# Patient Record
Sex: Male | Born: 1980
Health system: Southern US, Community
[De-identification: ages and names within clinical notes are randomized; demographics above are authoritative.]

## PROBLEM LIST (undated history)

## (undated) DIAGNOSIS — J45909 Unspecified asthma, uncomplicated: Secondary | ICD-10-CM

## (undated) DIAGNOSIS — J301 Allergic rhinitis due to pollen: Secondary | ICD-10-CM

## (undated) DIAGNOSIS — Z9884 Bariatric surgery status: Secondary | ICD-10-CM

## (undated) DIAGNOSIS — I10 Essential (primary) hypertension: Secondary | ICD-10-CM

## (undated) DIAGNOSIS — F988 Other specified behavioral and emotional disorders with onset usually occurring in childhood and adolescence: Secondary | ICD-10-CM

## (undated) DIAGNOSIS — S060X0A Concussion without loss of consciousness, initial encounter: Secondary | ICD-10-CM

## (undated) HISTORY — DX: Essential (primary) hypertension: I10

## (undated) HISTORY — DX: Unspecified asthma, uncomplicated: J45.909

## (undated) HISTORY — DX: Allergic rhinitis due to pollen: J30.1

## (undated) HISTORY — DX: Bariatric surgery status: Z98.84

## (undated) HISTORY — DX: Concussion without loss of consciousness, initial encounter: S06.0X0A

## (undated) HISTORY — DX: Other specified behavioral and emotional disorders with onset usually occurring in childhood and adolescence: F98.8

## (undated) HISTORY — PX: CHOLECYSTECTOMY: SHX55

---

## 2009-05-18 ENCOUNTER — Ambulatory Visit: Payer: Self-pay | Admitting: Otolaryngology

## 2009-08-23 ENCOUNTER — Ambulatory Visit: Payer: Self-pay | Admitting: Family Medicine

## 2009-08-23 DIAGNOSIS — F988 Other specified behavioral and emotional disorders with onset usually occurring in childhood and adolescence: Secondary | ICD-10-CM | POA: Insufficient documentation

## 2009-09-22 ENCOUNTER — Telehealth: Payer: Self-pay | Admitting: Family Medicine

## 2009-11-29 ENCOUNTER — Ambulatory Visit: Payer: Self-pay | Admitting: Family Medicine

## 2010-09-14 ENCOUNTER — Encounter: Payer: Self-pay | Admitting: Family Medicine

## 2010-10-17 ENCOUNTER — Ambulatory Visit: Payer: Self-pay | Admitting: Family Medicine

## 2010-10-17 DIAGNOSIS — I10 Essential (primary) hypertension: Secondary | ICD-10-CM | POA: Insufficient documentation

## 2010-10-17 HISTORY — DX: Essential (primary) hypertension: I10

## 2010-11-01 ENCOUNTER — Ambulatory Visit
Admission: RE | Admit: 2010-11-01 | Discharge: 2010-11-01 | Payer: Self-pay | Source: Home / Self Care | Attending: Family Medicine | Admitting: Family Medicine

## 2010-11-01 DIAGNOSIS — J45901 Unspecified asthma with (acute) exacerbation: Secondary | ICD-10-CM | POA: Insufficient documentation

## 2010-11-01 DIAGNOSIS — J4520 Mild intermittent asthma, uncomplicated: Secondary | ICD-10-CM | POA: Insufficient documentation

## 2010-12-04 NOTE — Assessment & Plan Note (Signed)
Summary: 9:00 ROA/RBH   Vital Signs:  Patient profile:   30 year old male Height:      74 inches Weight:      324.0 pounds BMI:     41.75 Temp:     97.8 degrees F oral Pulse rate:   80 / minute Pulse rhythm:   regular BP sitting:   108 / 80  (left arm) Cuff size:   large  Vitals Entered By: Benny Lennert CMA Duncan Dull) (November 29, 2009 9:08 AM)  History of Present Illness: Chief complaint patient needs new med for add  30 year old with positive adult ADD screen on last exam and was intolerant to [place].  Had some depression, but that got better some Felt somewhat agitated while on the medicine  Did not seem to have much positve effects - was on for a month. Does not really think that ist was helpful at all.   All depressed symptoms have resolved. Now not sleeping well and has a new puppy.   Did not listen well in class. Looking back, thinks that he may have been highly distractable in class.     Allergies (verified): No Known Drug Allergies  Past History:  Past medical, surgical, family and social histories (including risk factors) reviewed, and no changes noted (except as noted below).  Past Medical History: Reviewed history from 08/23/2009 and no changes required. ADD  Past Surgical History: Reviewed history from 08/23/2009 and no changes required. gallbladder 2007 chronic sinusitis 2010  Family History: Reviewed history from 08/23/2009 and no changes required. heart disease  Social History: Reviewed history from 08/23/2009 and no changes required. Occupation:software Art gallery manager Married Never Smoked Alcohol use-yes Drug use-no Regular exercise-yes   Impression & Recommendations:  Problem # 1:  ADD (ICD-314.00) >15 minutes spent in total face to face time with the patient with >50% of time spent in counselling and coordination of care: 100% in counselling, failure to respond to straterra with SE, still with symptoms, longstanding but no diagnosis as  a child. Discussed some of the risks of lifelong stimulants, and that i rec psych eval for full ADD evaluation.  Orders: Psychiatric Referral (Psych)  Complete Medication List: 1)  Claritin 10 Mg Tabs (Loratadine) .... One a day  Patient Instructions: 1)  Referral Appointment Information 2)  Day/Date: 3)  Time: 4)  Place/MD: 5)  Address: 6)  Phone/Fax: 7)  Patient given appointment information. Information/Orders faxed/mailed.   Current Allergies (reviewed today): No known allergies

## 2010-12-04 NOTE — Letter (Signed)
Summary: [facility]   [facility]   Imported By: Lanelle Bal 10/04/2010 11:01:44  _____________________________________________________________________  External Attachment:    Type:   Image     Comment:   External Document  Appended Document: [facility]  Patient should follow-up  ENT saw Blood pressure high, needs to be checked.  Appended Document: [facility]  Patient sent to front to schedule appt.Consuello Masse CMA

## 2010-12-06 NOTE — Assessment & Plan Note (Signed)
Summary: HECKING COUGH, CONGESTION / LFW   Vital Signs:  Patient profile:   30 year old male Height:      74 inches Weight:      337.0 pounds BMI:     43.42 Temp:     98.7 degrees F oral Pulse rate:   80 / minute Pulse rhythm:   regular BP sitting:   120 / 72  (left arm) Cuff size:   large  Vitals Entered By: Benny Lennert CMA Duncan Dull) (November 01, 2010 11:54 AM)  History of Present Illness: Chief complaint hecking cough with congestion  30 year old male:  Feeling bad for about a week thought was just allergies  has been taking allergy meds  cough comes and goes.  Has been lingering for a while.   Always has induced allergy induced asthma.   Acute Visit History:      The patient complains of cough, nasal discharge, sinus problems, and sore throat.  These symptoms began 1 week ago.  He denies abdominal pain, chest pain, diarrhea, earache, eye symptoms, and headache.  Other comments include: HISTORY OF ASTHMA.        The patient notes wheezing and shortness of breath.  There is no history of sleep interference, respiratory retractions, tachypnea, cyanosis, or interference with oral intake associated with his cough.        He complains of sinus pressure.  The patient has had a past history of sinusitis and previous sinus surgery.        Urine output has been normal.        Allergies (verified): No Known Drug Allergies  Past History:  Past medical, surgical, family and social histories (including risk factors) reviewed, and no changes noted (except as noted below).  Past Medical History: ADD Asthma Allergic Rhinitis  Past Surgical History: Reviewed history from 08/23/2009 and no changes required. gallbladder 2007 chronic sinusitis 2010   Family History: Reviewed history from 08/23/2009 and no changes required. heart disease  Social History: Reviewed history from 08/23/2009 and no changes required. 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 CV: No chest pain or SOB GI: No noted N or V Otherwise, pertinent positives and negatives are noted in the HPI.   Physical Exam  Additional Exam:  GEN: A and O x 3. WDWN. NAD.    ENT: Nose clear, ext NML.  No LAD.  No JVD.  TM's clear. Oropharynx clear.  PULM: Normal WOB, no distress. poor air movement diffusely with scattered rhonchi and wheezing. CV: RRR, no M/G/R, No rubs, No JVD.   ABD: S, NT, ND, + BS. No rebound. No guarding. No HSM.   EXT: warm and well-perfused, No c/c/e. PSYCH: Pleasant and conversant.    Impression & Recommendations:  Problem # 1:  BRONCHITIS- ACUTE (ICD-466.0)  His updated medication list for this problem includes:    Singulair 10 Mg Tabs (Montelukast sodium) ..... One tablet daily    Azithromycin 250 Mg Tabs (Azithromycin) .Marland Kitchen... 2 by  mouth today and then 1 daily for 4 days    Ventolin Hfa 108 (90 Base) Mcg/act Aers (Albuterol sulfate) .Marland Kitchen... 2 puffs q 4 hours as needed wheezing  Problem # 2:  ASTHMA NOS W/ACUTE EXACERBATION (ICD-493.92)  His updated medication list for this problem includes:    Singulair 10 Mg Tabs (Montelukast sodium) ..... One tablet daily  Ventolin Hfa 108 (90 Base) Mcg/act Aers (Albuterol sulfate) .Marland Kitchen... 2 puffs q 4 hours as needed wheezing  Complete Medication List: 1)  Claritin 10 Mg Tabs (Loratadine) .... One a day 2)  Ritalin 20 Mg Tabs (Methylphenidate hcl) .... Take one tablet twice daily 3)  Omnaris 50 Mcg/act Susp (Ciclesonide) .... 2 sprays in each nostril once daily 4)  Melatonin 5 Mg Tabs (Melatonin) .... One tablet as needed for sleep at bedtime 5)  Singulair 10 Mg Tabs (Montelukast sodium) .... One tablet daily 6)  Azithromycin 250 Mg Tabs (Azithromycin) .... 2 by  mouth today and then 1 daily for 4 days 7)  Ventolin Hfa 108 (90 Base) Mcg/act Aers (Albuterol sulfate) .... 2 puffs q 4 hours as  needed wheezing  Patient Instructions: 1)  BRONCHITIS 2)  -Viral or baterial infections of the lung. Fever, cough, chest pain, shortness of breath, phlegm production, fatigue are symptoms. 3)  Treatment: 4)  1. Take all medicines 5)  2. Antibiotics  6)  3. Cough suppressants 7)  4. Bronchodilators: an inhaler 8)  5. Expectorant like Guaifenesin (Robitussin, Mucinex) 9)  Fluids and Moisture help: drink lots of fluids 10)  Vaporizier or humidifier in room, shower steam 11)  --help loosen secretions and sooth breathing passages 12)  Elevate head slightly when trying to sleep.  Prescriptions: VENTOLIN HFA 108 (90 BASE) MCG/ACT AERS (ALBUTEROL SULFATE) 2 puffs q 4 hours as needed wheezing  #1 x 2   Entered and Authorized by:   Hannah Beat MD   Signed by:   Hannah Beat MD on 11/01/2010   Method used:   Electronically to        [id number] (retail)       [address]       [place], [place]  [address]       Ph: [phone]       Fax: [phone]   RxID:   [id number] AZITHROMYCIN 250 MG  TABS (AZITHROMYCIN) 2 by  mouth today and then 1 daily for 4 days  #6 x 0   Entered and Authorized by:   Hannah Beat MD   Signed by:   Hannah Beat MD on 11/01/2010   Method used:   Electronically to        [id number] (retail)       [address]       [place], [place]  [address]       Ph: [phone]       Fax: [phone]   RxID:   [id number]    Orders Added: 1)  Est. Patient Level IV [30160]    Current Allergies (reviewed today): No known allergies

## 2010-12-06 NOTE — Assessment & Plan Note (Signed)
Summary: DISCUSS BP PER DR COPLAND/CLE   Vital Signs:  Patient profile:   30 year old male Height:      74 inches Weight:      331.8 pounds BMI:     42.75 Temp:     98.7 degrees F oral Pulse rate:   80 / minute Pulse rhythm:   regular BP sitting:   132 / 90  (left arm) Cuff size:   large  Vitals Entered By: Benny Lennert CMA Duncan Dull) (October 17, 2010 10:49 AM)  Serial Vital Signs/Assessments:  Time      Position  BP       Pulse  Resp  Temp     By                     120/85                         Hannah Beat MD   History of Present Illness: Chief complaint discuss BP  Allergies (verified): No Known Drug Allergies   Impression & Recommendations:  Problem # 1:  ELEVATED BLOOD PRESSURE WITHOUT DIAGNOSIS OF HYPERTENSION (ICD-796.2) 120/85 with a thigh cuff I am not convinced this is BP.  He has 28 inch arms.  >15 minutes spent in face to face time with patient, >50% spent in counselling or coordination of care: discussed exercise modification, weight loss, exercise, running, biking, spinning. Weight goal is less than 300 by end of feb. Wants to get down to 240.   Problem # 2:  MORBID OBESITY (ICD-278.01)  Complete Medication List: 1)  Claritin 10 Mg Tabs (Loratadine) .... One a day 2)  Ritalin 20 Mg Tabs (Methylphenidate hcl) .... Take one tablet twice daily 3)  Omnaris 50 Mcg/act Susp (Ciclesonide) .... 2 sprays in each nostril once daily 4)  Melatonin 5 Mg Tabs (Melatonin) .... One tablet as needed for sleep at bedtime 5)  Singulair 10 Mg Tabs (Montelukast sodium) .... One tablet daily   Orders Added: 1)  Est. Patient Level III [14782]    Current Allergies (reviewed today): No known allergies

## 2011-10-25 ENCOUNTER — Other Ambulatory Visit: Payer: Self-pay | Admitting: Otolaryngology

## 2011-12-26 ENCOUNTER — Ambulatory Visit: Payer: Self-pay | Admitting: Otolaryngology

## 2011-12-27 LAB — PATHOLOGY REPORT

## 2012-05-20 LAB — URINALYSIS, COMPLETE
Bacteria: NONE SEEN
Bilirubin,UR: NEGATIVE
Glucose,UR: NEGATIVE mg/dL (ref 0–75)
Leukocyte Esterase: NEGATIVE
Nitrite: NEGATIVE
Protein: NEGATIVE
Specific Gravity: 1.021 (ref 1.003–1.030)

## 2012-05-20 LAB — COMPREHENSIVE METABOLIC PANEL
Alkaline Phosphatase: 76 U/L (ref 50–136)
Anion Gap: 11 (ref 7–16)
BUN: 12 mg/dL (ref 7–18)
Bilirubin,Total: 0.9 mg/dL (ref 0.2–1.0)
Co2: 25 mmol/L (ref 21–32)
Creatinine: 1.11 mg/dL (ref 0.60–1.30)
EGFR (African American): >60
EGFR (Non-African Amer.): >60
Potassium: 4.1 mmol/L (ref 3.5–5.1)
SGPT (ALT): 32 U/L

## 2012-05-20 LAB — CBC
MCH: 28 pg (ref 26.0–34.0)
MCHC: 32.5 g/dL (ref 32.0–36.0)
Platelet: 167 10*3/uL (ref 150–440)
WBC: 16.3 10*3/uL — ABNORMAL HIGH (ref 3.8–10.6)

## 2012-05-21 ENCOUNTER — Inpatient Hospital Stay: Payer: Self-pay | Admitting: Internal Medicine

## 2012-05-21 LAB — CSF CELL COUNT WITH DIFFERENTIAL
Eosinophil: 0 %
Lymphocytes: 73 %
Neutrophils: 3 %
Other Cells: 0 %
RBC (CSF): 25 /mm3
WBC (CSF): 2 /mm3

## 2012-05-21 LAB — PROTEIN, BODY FLUID

## 2012-05-21 LAB — GLUCOSE, SEROUS FLUID: Glucose, Body Fluid: 62 mg/dL

## 2012-05-22 LAB — CBC WITH DIFFERENTIAL/PLATELET
Basophil %: 0.7 %
Eosinophil #: 0.3 10*3/uL (ref 0.0–0.7)
Eosinophil %: 4.1 %
HCT: 41 % (ref 40.0–52.0)
Lymphocyte %: 23.7 %
Monocyte %: 21.1 %
Neutrophil #: 4.2 10*3/uL (ref 1.4–6.5)
Neutrophil %: 50.4 %
Platelet: 170 10*3/uL (ref 150–440)
RBC: 4.8 10*6/uL (ref 4.40–5.90)
RDW: 13.8 % (ref 11.5–14.5)
WBC: 8.4 10*3/uL (ref 3.8–10.6)

## 2012-05-22 LAB — CLOSTRIDIUM DIFFICILE BY PCR

## 2012-05-26 LAB — CULTURE, BLOOD (SINGLE)

## 2012-05-27 ENCOUNTER — Ambulatory Visit (INDEPENDENT_AMBULATORY_CARE_PROVIDER_SITE_OTHER): Payer: 59 | Admitting: Family Medicine

## 2012-05-27 ENCOUNTER — Encounter: Payer: Self-pay | Admitting: Family Medicine

## 2012-05-27 VITALS — BP 120/82 | HR 74 | Temp 98.4°F | Ht 73.0 in | Wt 282.8 lb

## 2012-05-27 DIAGNOSIS — R935 Abnormal findings on diagnostic imaging of other abdominal regions, including retroperitoneum: Secondary | ICD-10-CM

## 2012-05-27 DIAGNOSIS — K5000 Crohn's disease of small intestine without complications: Secondary | ICD-10-CM

## 2012-05-27 NOTE — Progress Notes (Signed)
   Nature conservation officer at [facility] [address] [place] [address] Phone: [phone] Fax: [phone]  Date:  05/27/2012   Name:  Tristan Mcgee   DOB:  [birth year]   MRN:  [id number]  PCP:  Hannah Beat, MD    Chief Complaint: Follow-up   History of Present Illness:  Tristan Mcgee is a 31 y.o. very pleasant male patient who presents with the following:  Hospital f/u: terminal ileitus:  Admit: 05/21/2012 D/c 05/22/2012.   Patient was admitted to the hospital with an elevated white count, headache, fever. Ultimately, he had a lumbar puncture which was unremarkable, and he did have an abnormal CT of the abdomen and pelvis, with presumed terminal ileitis. Infectious versus inflammatory. He did improve and had a decreasing white count with by mouth ingestion of Flagyl and Cipro. Now he does have some diarrhea. Abdominal pain is much improved.  Past Medical History, Surgical History, Social History, Family History, Problem List, Medications, and Allergies have been reviewed and updated if relevant.  Current Outpatient Prescriptions on File Prior to Visit  Medication Sig Dispense Refill  . albuterol (PROVENTIL HFA;VENTOLIN HFA) 108 (90 BASE) MCG/ACT inhaler Inhale 2 puffs into the lungs every 6 (six) hours as needed.      . Loratadine 10 MG CAPS Take 1 capsule by mouth daily.      . methylphenidate (RITALIN) 20 MG tablet Take 20 mg by mouth 2 (two) times daily.      . montelukast (SINGULAIR) 10 MG tablet Take 10 mg by mouth at bedtime.        Review of Systems: ROS: GEN: Acute illness details above GI: Tolerating PO intake GU: maintaining adequate hydration and urination Pulm: No SOB Interactive and getting along well at home.  Otherwise, ROS is as per the HPI.   Physical Examination: Filed Vitals:   05/27/12 1039  BP: 120/82  Pulse: 74  Temp: 98.4 F (36.9 C)   Filed Vitals:   05/27/12 1039  Height: 6\' 1"  (1.854 m)  Weight: 282 lb 12.8 oz (128.277 kg)   Body  mass index is 37.31 kg/(m^2). Ideal Body Weight: Weight in (lb) to have BMI = 25: 189.1    GEN: WDWN, NAD, Non-toxic, A & O x 3 HEENT: Atraumatic, Normocephalic. Neck supple. No masses, No LAD. Ears and Nose: No external deformity. CV: RRR, No M/G/R. No JVD. No thrill. No extra heart sounds. PULM: CTA B, no wheezes, crackles, rhonchi. No retractions. No resp. distress. No accessory muscle use. EXTR: No c/c/e NEURO Normal gait.  PSYCH: Normally interactive. Conversant. Not depressed or anxious appearing.  Calm demeanor.    Assessment and Plan: 1. Terminal ileitis  Ambulatory referral to Gastroenterology  2. Abnormal abdominal CT scan  Ambulatory referral to Gastroenterology    Much improved, we need to make sure that he has appropriate followup with Dr. Riki Altes followup with his ileum changes seen on CT.   Hannah Beat, MD

## 2012-06-26 ENCOUNTER — Ambulatory Visit: Payer: Self-pay | Admitting: Gastroenterology

## 2012-10-20 ENCOUNTER — Telehealth: Payer: Self-pay | Admitting: Family Medicine

## 2012-10-20 ENCOUNTER — Encounter: Payer: Self-pay | Admitting: Family Medicine

## 2012-10-20 ENCOUNTER — Ambulatory Visit (INDEPENDENT_AMBULATORY_CARE_PROVIDER_SITE_OTHER): Payer: 59 | Admitting: Family Medicine

## 2012-10-20 VITALS — BP 130/82 | HR 65 | Temp 98.2°F | Ht 73.0 in | Wt 303.5 lb

## 2012-10-20 DIAGNOSIS — M545 Low back pain, unspecified: Secondary | ICD-10-CM | POA: Insufficient documentation

## 2012-10-20 DIAGNOSIS — R350 Frequency of micturition: Secondary | ICD-10-CM | POA: Insufficient documentation

## 2012-10-20 DIAGNOSIS — J029 Acute pharyngitis, unspecified: Secondary | ICD-10-CM | POA: Insufficient documentation

## 2012-10-20 LAB — POCT URINALYSIS DIPSTICK
Bilirubin, UA: NEGATIVE
Blood, UA: NEGATIVE
Glucose, UA: NEGATIVE
Ketones, UA: NEGATIVE
Leukocytes, UA: NEGATIVE
Nitrite, UA: NEGATIVE
Protein, UA: NEGATIVE
Spec Grav, UA: 1.02
Urobilinogen, UA: 0.2
pH, UA: 6

## 2012-10-20 NOTE — Progress Notes (Signed)
Subjective:    Patient ID: Tristan Mcgee, male    DOB: Apr 04, 1981, 31 y.o.   MRN: [id number]  Urinary Frequency  This is a new problem. The current episode started in the past 7 days (in last 4 days). The problem occurs intermittently. The problem has been waxing and waning. The pain is at a severity of 0/10. The patient is experiencing no pain. There has been no fever. He is sexually active. There is no history of pyelonephritis. Associated symptoms include frequency and urgency. Pertinent negatives include no chills, discharge, flank pain, hematuria, nausea, sweats or vomiting. Associated symptoms comments: Low back pain at waist level on left x yesterday.. Constant pain then but improved today. Uncomfortable but not severe. More pain with movement.  No known injury,  He has changed exercise in last few weeks. Lifting weights in last few weeks.. He has tried NSAIDs for the symptoms. The treatment provided mild relief. There is no history of catheterization, kidney stones, recurrent UTIs, a single kidney, urinary stasis or a urological procedure.  Sore Throat  This is a new problem. The current episode started in the past 7 days. The pain is worse on the left side. There has been no fever. Associated symptoms include congestion and headaches. Pertinent negatives include no coughing, ear pain, shortness of breath, swollen glands, trouble swallowing or vomiting. Associated symptoms comments: Mild post nasal drip. He has had no exposure to strep or mono. Exposure to: Had tonsils out in Feb for tonsil stones.Marland Kitchen no problems since then.HAs been around dust lately which may be irritating things..     31 year old pt of Dr. Cyndie Chime.    Review of Systems  Constitutional: Negative for chills.  HENT: Positive for congestion. Negative for ear pain and trouble swallowing.   Respiratory: Negative for cough and shortness of breath.   Gastrointestinal: Negative for nausea and vomiting.  Genitourinary: Positive  for urgency and frequency. Negative for hematuria and flank pain.  Musculoskeletal: Positive for back pain.  Neurological: Positive for headaches.       Objective:   Physical Exam  Constitutional: Vital signs are normal. He appears well-developed and well-nourished.  Non-toxic appearance. He does not appear ill. No distress.  HENT:  Head: Normocephalic and atraumatic.  Right Ear: Hearing, tympanic membrane, external ear and ear canal normal. No tenderness. No foreign bodies. Tympanic membrane is not retracted and not bulging.  Left Ear: Hearing, tympanic membrane, external ear and ear canal normal. No tenderness. No foreign bodies. Tympanic membrane is not retracted and not bulging.  Nose: Nose normal. No mucosal edema or rhinorrhea. Right sinus exhibits no maxillary sinus tenderness and no frontal sinus tenderness. Left sinus exhibits no maxillary sinus tenderness and no frontal sinus tenderness.  Mouth/Throat: Uvula is midline, oropharynx is clear and moist and mucous membranes are normal. Normal dentition. No dental caries. No oropharyngeal exudate or tonsillar abscesses.  Eyes: Conjunctivae normal, EOM and lids are normal. Pupils are equal, round, and reactive to light. No foreign bodies found.  Neck: Trachea normal, normal range of motion and phonation normal. Neck supple. Carotid bruit is not present. No mass and no thyromegaly present.  Cardiovascular: Normal rate, regular rhythm, S1 normal, S2 normal, normal heart sounds, intact distal pulses and normal pulses.  Exam reveals no gallop.   No murmur heard. Pulmonary/Chest: Effort normal and breath sounds normal. No respiratory distress. He has no wheezes. He has no rhonchi. He has no rales.  Abdominal: Soft. Normal appearance and bowel sounds are  normal. There is no hepatosplenomegaly. There is tenderness in the suprapubic area. There is no rebound, no guarding and no CVA tenderness. No hernia.       ttp over left lumbar spine mild, neg  SLR, neg Faber's, strenght in lower ext 5/5.  Neurological: He is alert. He has normal reflexes.  Skin: Skin is warm, dry and intact. No rash noted.  Psychiatric: He has a normal mood and affect. His speech is normal and behavior is normal. Judgment normal.          Assessment & Plan:

## 2012-10-20 NOTE — Patient Instructions (Addendum)
Stop caffeine, avoif tomato, citris, alcohol, chocolate, smoking. Push water intake to 8 x 8 ox glasses a day. Gentle stretching exercises for back, can use ibuprofen as needed. Limit heavy lifting until improving. Call if symptoms not improving.

## 2012-10-20 NOTE — Assessment & Plan Note (Signed)
Likely musculoskeletal. Stretching info given. Can use NSAID.  Limit heavy lifting until improving.

## 2012-10-20 NOTE — Telephone Encounter (Signed)
Patient Information:  Caller Name: Marquell  Phone: [phone]  Patient: Tristan Mcgee, Tristan Mcgee  Gender: Male  DOB: 08/04/1981  Age: 31 Years  PCP: Hannah Beat (Family Practice)  Office Follow Up:  Does the office need to follow up with this patient?: No  Instructions For The Office: N/A  RN Note:  Patient is having back pain, urinary frequency and has a sore throat.  Symptoms  Reason For Call & Symptoms: frequent urination, back pain and sore throat  Reviewed Health History In EMR: Yes  Reviewed Medications In EMR: Yes  Reviewed Allergies In EMR: Yes  Reviewed Surgeries / Procedures: Yes  Date of Onset of Symptoms: 10/15/2012  Treatments Tried: Ibuprofen  Treatments Tried Worked: No  Guideline(s) Used:  Urination Pain - Male  Disposition Per Guideline:   Go to Office Now  Reason For Disposition Reached:   Side (flank) or lower back pain present  Advice Given:  Call Back If:  You become worse.  Appointment Scheduled:  10/20/2012 09:00:00 Appointment Scheduled Provider:  [facility])

## 2012-10-20 NOTE — Assessment & Plan Note (Signed)
Likely viral of allergic, irritant...symptomatic care.

## 2012-10-20 NOTE — Assessment & Plan Note (Addendum)
No sign of infection.  No clear suggestion of acute prostatitis.No glucose suggesting diabetes. Will check fasting  Glucose to eval further for DM given obesity. Most likely bladder irritation. Recommended dietary changes, pushing fluids and follow up if not improving.   PT OPTED AGAINST GLUCOSE TEST. No family history of DM. No thirst, mild fatigue.

## 2013-11-04 HISTORY — PX: SLEEVE GASTROPLASTY: SHX1101

## 2014-02-27 IMAGING — CT CT HEAD WITHOUT CONTRAST
1 series · 16 of 30 positions shown, 20 images · non-contrast
Comparison: none

REASON FOR EXAM: sinusitis x 1 week, headache, neck pain
COMMENTS:

PROCEDURE:     CT  - CT HEAD WITHOUT CONTRAST  - May 20, 2012 [DATE]
RESULT:     Comparison:  None
TECHNIQUE: Multiple axial images from the foramen magnum to the vertex were
obtained without IV contrast.

[Series 2: soft tissue · axial · 0.46mm/px · z∈[-104,+41]mm · 16 of 33 slices shown, 20 images]
[im 2/33  brain]
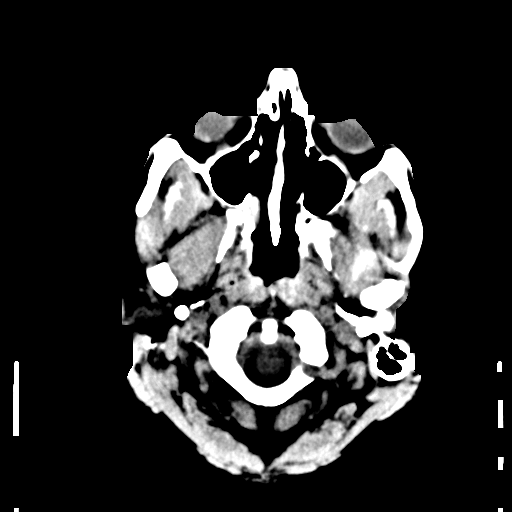
[im 2/33  bone]
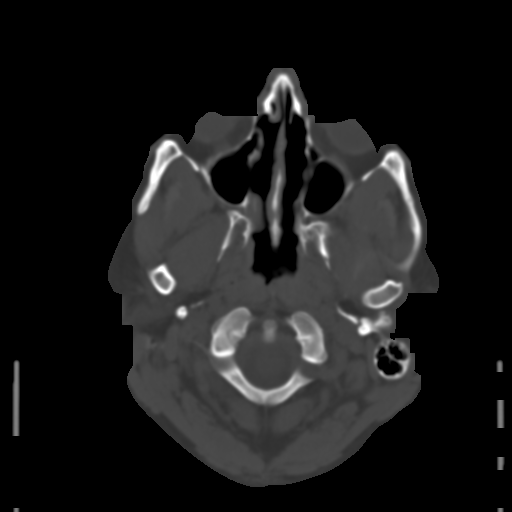
[im 4/33  brain]
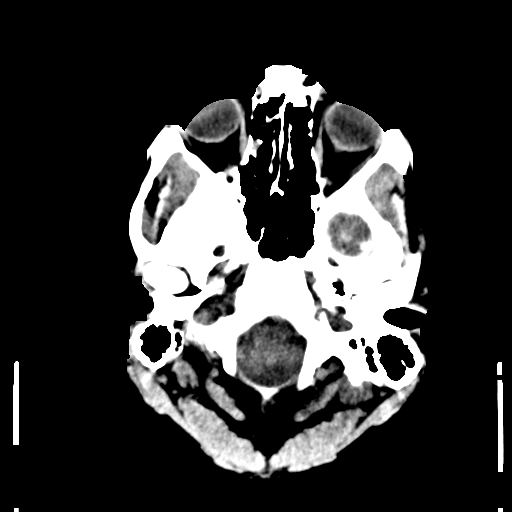
[im 6/33  brain]
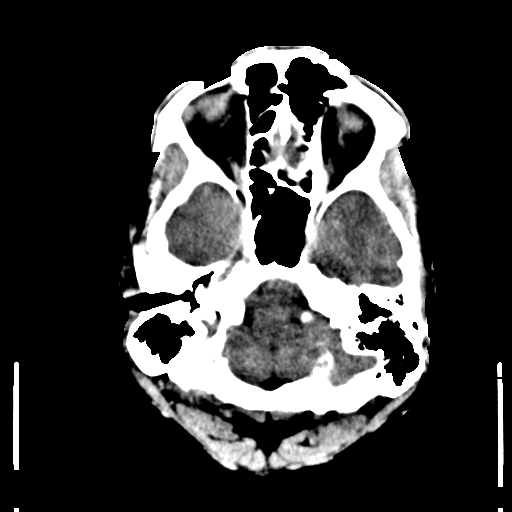
[im 8/33  brain]
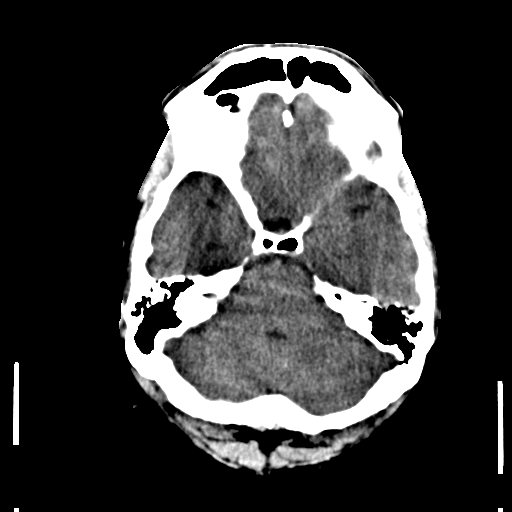
[im 9/33  brain]
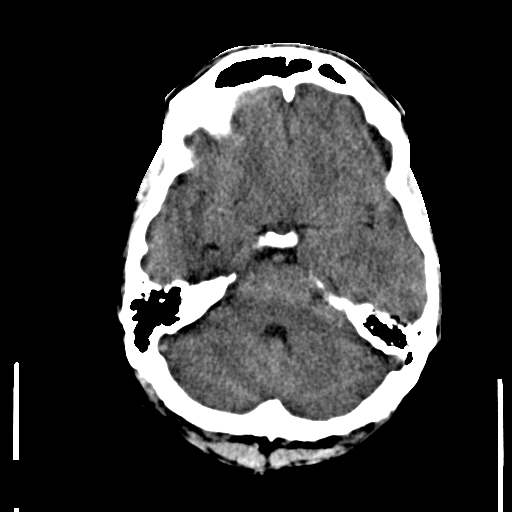
[im 9/33  bone]
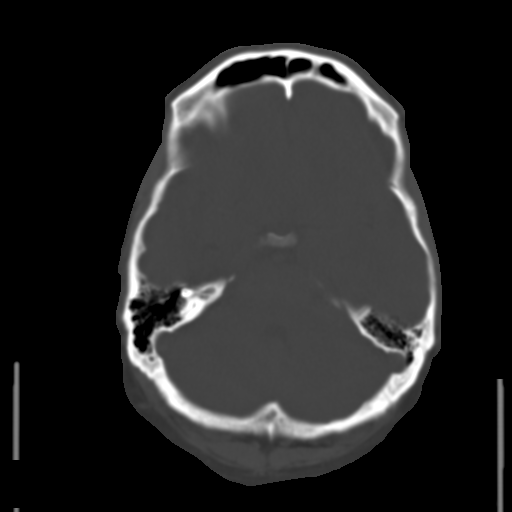
[im 12/33  brain]
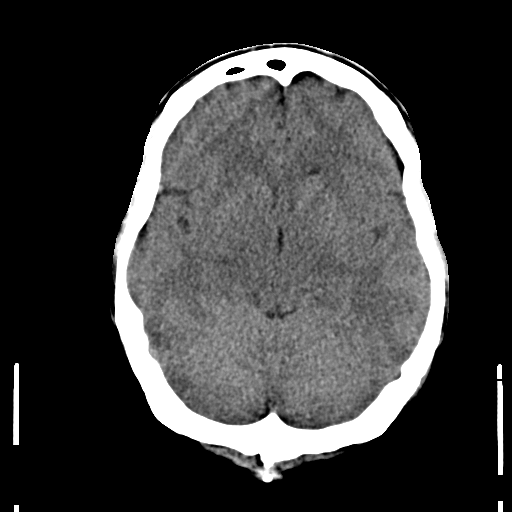
[im 14/33  brain]
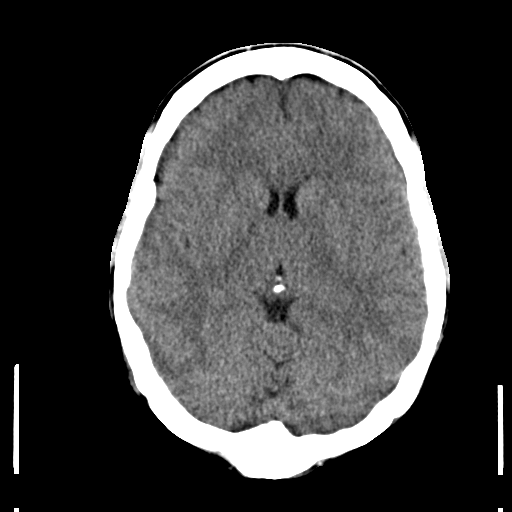
[im 16/33  brain]
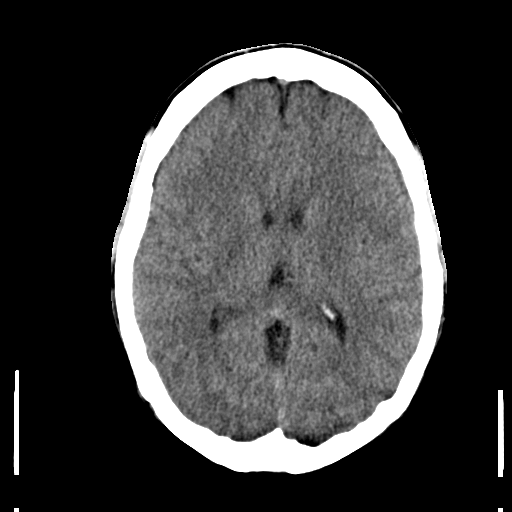
[im 17/33  brain]
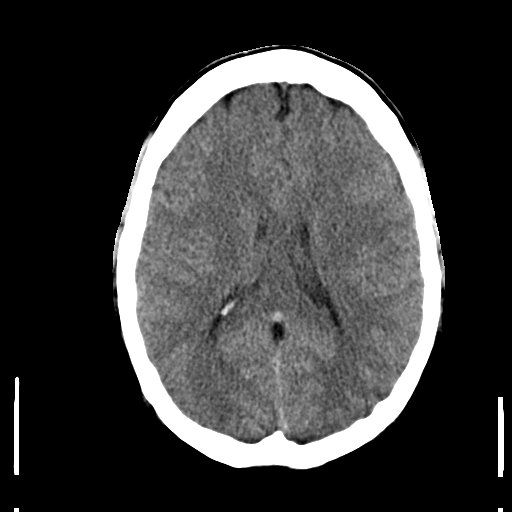
[im 17/33  bone]
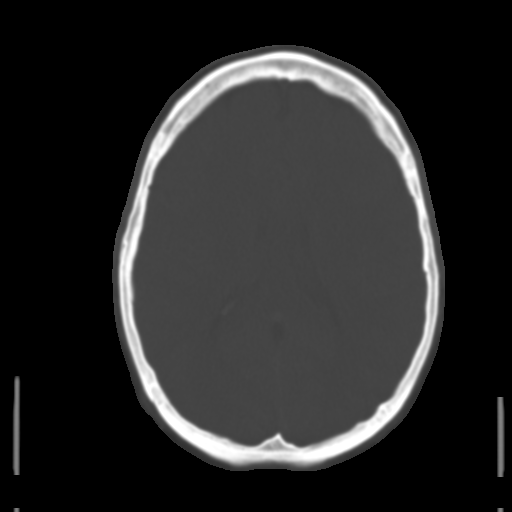
[im 19/33  brain]
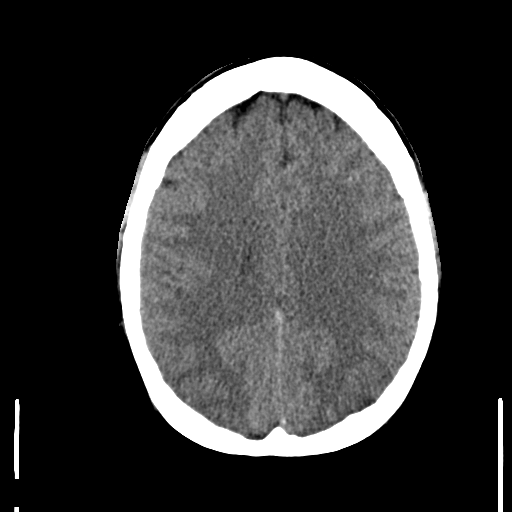
[im 21/33  brain]
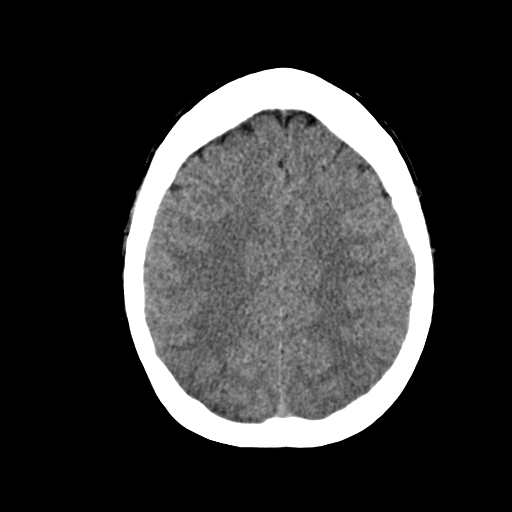
[im 24/33  brain]
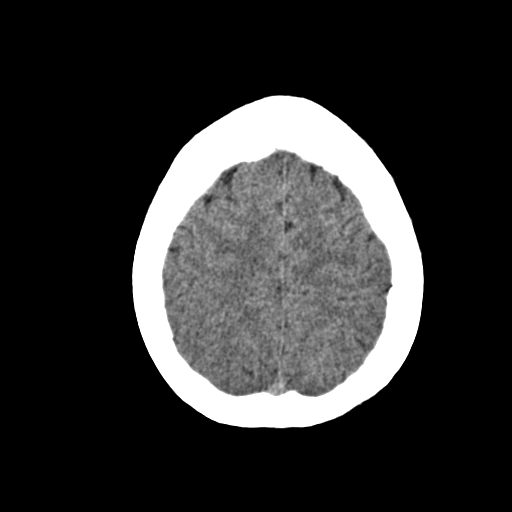
[im 25/33  brain]
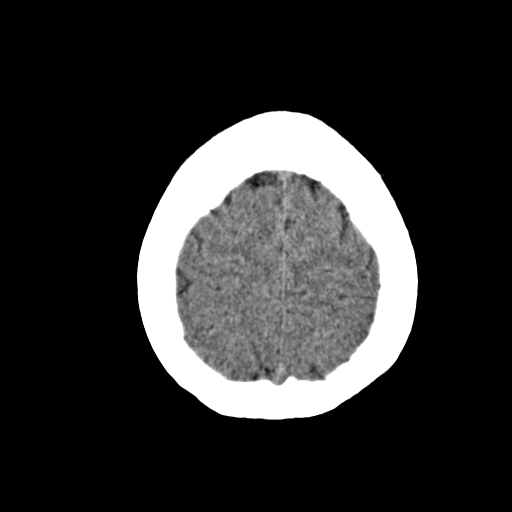
[im 25/33  bone]
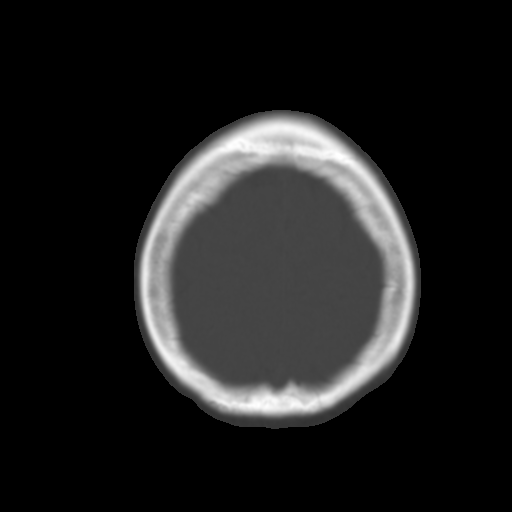
[im 27/33  brain]
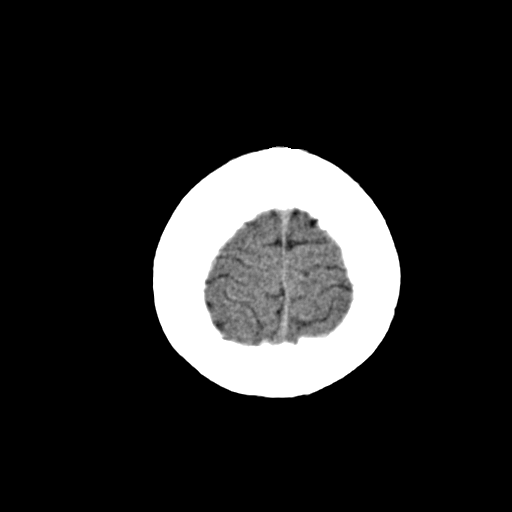
[im 29/33  brain]
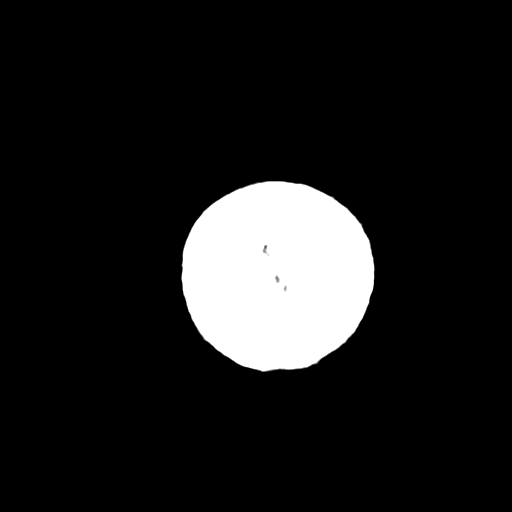
[im 31/33  brain]
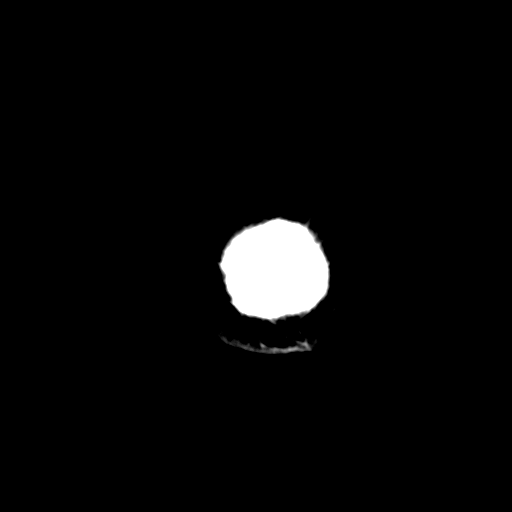

[16 of 30 positions shown; findings below may reference images not displayed]

FINDINGS: There is no evidence for mass effect, midline shift, or extra-axial fluid
collections. There is no evidence for space-occupying lesion, intracranial
hemorrhage, or cortical-based area of infarction.

Postoperative changes seen from resection of the medial walls of the
maxillary sinuses bilaterally. There is minimal mucosal thickening of the
right ethmoid air cells.

The osseous structures are unremarkable.
IMPRESSION: No acute intracranial process.

## 2014-12-02 ENCOUNTER — Telehealth: Payer: Self-pay | Admitting: Family Medicine

## 2014-12-02 NOTE — Telephone Encounter (Signed)
[facility] Client TELEPHONE ADVICE RECORD [facility] Patient Name: Tristan Mcgee DOB: 08/27/1981 Initial Comment Caller states he has a history of high blood pressure, does not have a recent reading. As well is having lower back pain on the left side. Nurse Assessment Nurse: Apolinar JunesBrandon, RN, Darl PikesSusan Date/Time Lamount Cohen(Eastern Time): 12/02/2014 1:26:11 PM Confirm and document reason for call. If symptomatic, describe symptoms. ---Caller states he has a history of high blood pressure, does not have a recent reading. As well is having lower back pain on the left side - states he ha had lower back pain from sedentary lifestyle and has been trying to be up and moving more - had gastric sleeve in December - states he noticed this pain again about 1 and 1/2 weeks ago - he takes Advil or Tylenol as needed for the pain - states it does come and go and has never been severe - no issues with urination - no pain or bleeding - he was in to see the ENT about 2 weeks ago and that is when his doctor mentioned something about BP being high - he can not even tell me when his last visit to Dr [place] was Has the patient traveled out of the country within the last 30 days? ---Not Applicable Does the patient require triage? ---Yes Related visit to physician within the last 2 weeks? ---No Does the PT have any chronic conditions? (i.e. diabetes, asthma, etc.) ---No Guidelines Guideline Title Affirmed Question Affirmed Notes High Blood Pressure [1] BP # 140/90 AND [2] not taking BP medications Back Pain Back pain present > 2 weeks Final Disposition User See PCP within 2 Brock BadWeeks Brandon, RN, Darl PikesSusan Comments The patients wife, Cordelia PenSherry, made the call but she wanted us to call [name] back instead. caller states they have set him up appointment on Wednesday next week but wanted him ot speak with triage nurse also

## 2014-12-02 NOTE — Telephone Encounter (Signed)
Pt already has appt with Dr Patsy Lageropland on 12/07/14 at 9:30 am.

## 2014-12-07 ENCOUNTER — Encounter: Payer: Self-pay | Admitting: Family Medicine

## 2014-12-07 ENCOUNTER — Ambulatory Visit (INDEPENDENT_AMBULATORY_CARE_PROVIDER_SITE_OTHER): Payer: BLUE CROSS/BLUE SHIELD | Admitting: Family Medicine

## 2014-12-07 VITALS — BP 145/93 | HR 87 | Temp 98.7°F | Ht 72.0 in | Wt 283.2 lb

## 2014-12-07 DIAGNOSIS — I10 Essential (primary) hypertension: Secondary | ICD-10-CM

## 2014-12-07 DIAGNOSIS — M545 Low back pain, unspecified: Secondary | ICD-10-CM

## 2014-12-07 NOTE — Progress Notes (Signed)
Pre visit review using our clinic review tool, if applicable. No additional management support is needed unless otherwise documented below in the visit note. 

## 2014-12-07 NOTE — Progress Notes (Signed)
Dr. Karleen HampshireSpencer T. Jasiah Elsen, MD, CAQ [facility] [address] [place], [id number] Phone: [phone] Fax: [phone]  12/07/2014  Patient: Tristan Mcgee, MRN: [id number], DOB: 1981-05-01, 34 y.o.  Primary Physician:  Hannah BeatSpencer Summerlynn Glauser, MD  Chief Complaint: Hypertension and Back Pain  Subjective:   Tristan Mcgee is a 34 y.o. very pleasant male patient who presents with the following:  High blood pressure: elevated a few different places. ENT, Derm.   Wt Readings from Last 3 Encounters:  12/07/14 283 lb 4 oz (128.481 kg)  10/20/12 303 lb 8 oz (137.667 kg)  05/27/12 282 lb 12.8 oz (128.277 kg)    BP will fluctuate quite a bit. And get up  Sleeve gastrectomy in November. Lost 50 pounds.  338 pre-op diet weight.  He has had some headaches, but otherwise he is feeling relatively well. His blood pressure has been approximately 140/90 for some time. He is not currently on any medication. His sleeve gastrectomy was in November of 2015.  He also has back pain, more on the left and in the SI joint on the left. No specific injury that he can think of. He has not been working out or lifting weights recently. He has not done any recent core work. He cannot take NSAIDs secondary to his gastric sleep. He is taking some Tylenol occasionally.  Past Medical History, Surgical History, Social History, Family History, Problem List, Medications, and Allergies have been reviewed and updated if relevant.   GEN: No acute illnesses, no fevers, chills. GI: No n/v/d, eating normally Pulm: No SOB Interactive and getting along well at home.  Otherwise, ROS is as per the HPI.  Objective:   BP 145/93 mmHg  Pulse 87  Temp(Src) 98.7 F (37.1 C) (Oral)  Ht 6' (1.829 m)  Wt 283 lb 4 oz (128.481 kg)  BMI 38.41 kg/m2  GEN: WDWN, NAD, Non-toxic, A & O x 3 HEENT: Atraumatic, Normocephalic. Neck supple. No masses, No LAD. Ears and Nose: No external deformity. CV:  RRR, No M/G/R. No JVD. No thrill. No extra heart sounds. PULM: CTA B, no wheezes, crackles, rhonchi. No retractions. No resp. distress. No accessory muscle use. EXTR: No c/c/e NEURO Normal gait.  PSYCH: Normally interactive. Conversant. Not depressed or anxious appearing.  Calm demeanor.    GEN: Well-developed,well-nourished,in no acute distress; alert,appropriate and cooperative throughout examination HEENT: Normocephalic and atraumatic without obvious abnormalities. Ears, externally no deformities PULM: Breathing comfortably in no respiratory distress EXT: No clubbing, cyanosis, or edema PSYCH: Normally interactive. Cooperative during the interview. Pleasant. Friendly and conversant. Not anxious or depressed appearing. Normal, full affect.  Range of motion at  the waist: Flexion: normal Extension: normal Lateral bending: normal Rotation: all normal  No echymosis or edema Rises to examination table with no difficulty Gait: non antalgic  Inspection/Deformity: N Paraspinus Tenderness: mild L > R, L4-5  B Ankle Dorsiflexion (L5,4): 5/5 B Great Toe Dorsiflexion (L5,4): 5/5 Heel Walk (L5): WNL Toe Walk (S1): WNL Rise/Squat (L4): WNL  SENSORY B Medial Foot (L4): WNL B Dorsum (L5): WNL B Lateral (S1): WNL Light Touch: WNL Pinprick: WNL  REFLEXES Knee (L4): 2+ Ankle (S1): 2+  B SLR, seated: neg B SLR, supine: neg B FABER: neg B Reverse FABER: neg B Greater Troch: NT B Log Roll: neg B Stork: NT B Sciatic Notch: L TTP  Laboratory and Imaging Data:  Assessment and Plan:   Left-sided low back pain without sciatica  Essential  hypertension  >25 minutes spent in face to face time with patient, >50% spent in counselling or coordination of care: Given that the patient just had a sleeve gastrectomy, and he is just starting to have some hypertensive readings, I would not start any medications at this point.  He can be expected to continue to lose a dramatic amount of weight  in the upcoming year, so I think that there is a high likelihood that his blood pressure will correct without pharmacological intervention.  He is having some mechanical back pain, and he has a history of being morbidly obese.  He is losing weight, and starting to exercise.  At this point, I would only start doing some core rehabilitation and start working on losing weight.  Tylenol when necessary pain.  He agrees with the plan of care in both situations.  I will recheck his blood pressure in 6 months.  Follow-up: Return in about 6 months (around 06/07/2015).  Signed,  Elpidio Galea. Jontae Adebayo, MD   Patient's Medications  New Prescriptions   No medications on file  Previous Medications   ALBUTEROL (PROVENTIL HFA;VENTOLIN HFA) 108 (90 BASE) MCG/ACT INHALER    Inhale 2 puffs into the lungs every 6 (six) hours as needed.   LORATADINE 10 MG CAPS    Take 1 capsule by mouth daily.   MELATONIN 5 MG TABS    Take 1 tablet by mouth daily.   METHYLPHENIDATE (RITALIN) 20 MG TABLET    Take 20 mg by mouth 2 (two) times daily.   MOMETASONE (NASONEX) 50 MCG/ACT NASAL SPRAY    Place 2 sprays into the nose daily.   MONTELUKAST (SINGULAIR) 10 MG TABLET    Take 10 mg by mouth at bedtime.   OMEPRAZOLE (PRILOSEC) 40 MG CAPSULE    Take 40 mg by mouth daily as needed.   Modified Medications   No medications on file  Discontinued Medications   CICLESONIDE (ZETONNA) 37 MCG/ACT AERS    Place into the nose.

## 2014-12-11 ENCOUNTER — Encounter: Payer: Self-pay | Admitting: Family Medicine

## 2015-02-21 NOTE — Discharge Summary (Signed)
PATIENT NAME:  Tristan Mcgee, [name] MR#:  [id number] DATE OF BIRTH:  10-30-1981  DATE OF ADMISSION:  05/21/2012 DATE OF DISCHARGE:  05/22/2012  ADMITTING DIAGNOSIS: Headache.    DISCHARGE DIAGNOSES:  1. Fever, headache, terminal ileitis on CT scan.  2. Abdominal pain, likely due to bowel inflammation,  infectious versus inflammatory etiology.  3. Leukocytosis, resolved on antibiotic therapy.   DISCHARGE CONDITION: Stable.   DISCHARGE MEDICATIONS: The patient is to resume his outpatient medications which are:  1. Albuterol ipratropium 2 puffs 4 times daily as needed.  2. Methylphenidate 20-mg tablet twice daily.  3. Omeprazole 10 mg p.o. daily as needed.  4. Clobetasol 0.05% topical cream one application topically twice daily.  5. Claritin 10 mg p.o. at bedtime.  6. Zetonna 37-mcg inhalation nasal aerosol, one spray once daily at bedtime.  7. Singulair 10 mg p.o. daily.  8. Lortab elixir 10 to 15 mL every four hours as needed.  9. Reglan 10 mg p.o. twice daily.  10. Ciprofloxacin 500 mg p.o. twice daily for seven days and Flagyl 500 mg p.o. 3 times daily for seven days.   HOME OXYGEN: None.   DIET: Regular consistency.  ACTIVITY LIMITATIONS: As tolerated.   FOLLOW-UP APPOINTMENT:  Follow-up appointment with Dr. Bluford Kaufmannh one week after discharge as well as Dr. Patsy Lageropland two days after discharge.     CONSULTANTS: 1. Dr. Bluford Kaufmannh, gastroenterology. 2. Dr. Excell Seltzerooper, Dr. Egbert GaribaldiBird, surgery.  RADIOLOGIC STUDIES:  1. Abdominal flat and erect 05/21/2012 revealed nonspecific and nonobstructive bowel gas pattern with moderate to large amount of stool.  2. CT of the head without contrast 05/20/2012 revealed no acute intracranial process.  3. CT of the abdomen and pelvis with contrast on 05/21/2012 revealed mild wall thickening of the terminal ileum. This may be infectious or inflammatory. There are several mildly enlarged lymph nodes in the right lower quadrant, which are nonspecific but likely reactive. Appendix  is normal according to the radiologist. 4. Fluoroscopy for lumbar puncture 05/21/2012 revealed successful lumbar puncture under fluoroscopic guidance.  HISTORY OF PRESENT ILLNESS: The patient is a 34 year old Caucasian male with no significant past medical history who presented to the hospital with complaints of headaches as well as fevers and abdominal pain. Please refer to Dr. Riley Nearingarwish's admission note on 05/21/2012. On arrival to the hospital, the patient's temperature was 99.8, pulse 72, respiratory rate 15, blood pressure 150/80, saturation 96% on room air. Physical exam revealed moderate tenderness in the right lower quadrant but not exactly at the appendix site or McBurney's point, but more proximal according to the admitting physician and more central. There was no rigidity or rebound noted.   LABORATORY DATA:  05/20/2012:  Normal BMP as well as almost normal hepatic panel except total protein was elevated at 8.9. White blood cell count was high at 16.3, hemoglobin was 15.5, platelet count 167. Blood cultures times two did not show any growth.   HOSPITAL COURSE: The patient was admitted to the hospital. Because of severe headaches and fever he underwent lumbar puncture under fluoroscopic guidance. CSF was colorless, clear, 25 red blood cells were noted in the CSF,  3% neutrophils, 73% lymphocytes, 23% monocytes. No eosinophils or white blood cells were noted.  Glucose level was normal at 62, protein level was normal at 36. The patient's CSF culture showed no growth in 24 hours. Rare white blood cells were noted but no organisms were seen. It was felt then that the patient's fever and possibly headache were related to intra-abdominal inflammation.  The patient was started on Zosyn and his condition improved. With conservative management and Zosyn, the patient's CBC normalized. His white blood cell count returned back to normal at 8.4 on 05/22/2012, and the patient's abdominal pain improved. The patient  was consulted by surgery and gastroenterology. Surgery felt that the patient did not have appendicitis and no surgical intervention was necessary. The gastroenterologist saw the patient in consultation on 05/22/2012 and felt that was okay to discharge the patient on Flagyl and Cipro and follow up with him in the office 1 to 2 days after discharge. Then would likely arrange colonoscopy to the terminal ileum and make decisions about further management depending on those results. The patient's condition improved. He was able to eat a regular diet with no significant abdominal pains. His fever also subsided. His vital signs on the day of discharge, 05/22/2012, showed temperature was 98.8, pulse 86 to 90s, respiration rate 16, blood pressure 133/84, saturation was 94% to 96% on room air at rest. He was able to ambulate with no significant discomfort and did not require any pain medications. He is being discharged in stable condition with the above-mentioned medications and followup.   TIME SPENT: 40 minutes.  ____________________________ Katharina Caper, MD rv:bjt D: 05/22/2012 18:27:30 ET T: 05/25/2012 09:56:24 ET JOB#: [id number]  cc: Katharina Caper, MD, <Dictator> Ezzard Standing. Bluford Kaufmann, MD Hannah Beat, MD Kashari Chalmers Winona Legato MD ELECTRONICALLY SIGNED 05/27/2012 16:23

## 2015-02-21 NOTE — Consult Note (Signed)
Chief Complaint:   Subjective/Chief Complaint Feels much better after BM's. Given miralax last night. X-ray showed patient FOS. Mild RLQ pain.   VITAL SIGNS/ANCILLARY NOTES: **Vital Signs.:   19-Jul-13 06:41   Vital Signs Type Routine   Temperature Temperature (F) 98.2   Celsius 36.7   Temperature Source Oral   Pulse Pulse 86   Respirations Respirations 16   Systolic BP Systolic BP 94   Diastolic BP (mmHg) Diastolic BP (mmHg) 60   Mean BP 71   Pulse Ox % Pulse Ox % 96   Pulse Ox Activity Level  At rest   Oxygen Delivery Room Air/ 21 %   Brief Assessment:   Cardiac Regular    Respiratory clear BS    Gastrointestinal mild RLQ tenderness. Much better than yest.   Lab Results: Routine Hem:  19-Jul-13 05:06    Erythrocyte Sed Rate  29 (Result(s) reported on 22 May 2012 at [facility]06:05AM.)   WBC (CBC) 8.4   RBC (CBC) 4.80   Hemoglobin (CBC) 13.6   Hematocrit (CBC) 41.0   Platelet Count (CBC) 170   MCV 85   MCH 28.4   MCHC 33.2   RDW 13.8   Neutrophil % 50.4   Lymphocyte % 23.7   Monocyte % 21.1   Eosinophil % 4.1   Basophil % 0.7   Neutrophil # 4.2   Lymphocyte # 2.0   Monocyte #  1.8   Eosinophil # 0.3   Basophil # 0.1 (Result(s) reported on 22 May 2012 at [facility]:05AM.)   Radiology Results: XRay:    18-Jul-13 19:51, Abdomen Flat and Erect   Abdomen Flat and Erect    REASON FOR EXAM:    right abd pain, abnormal abd CT- CALL REPORT TO DR. Chisum Habenicht  COMMENTS:       PROCEDURE: DXR - DXR ABDOMEN 2 V FLAT AND ERECT  - May 21 2012  7:51PM     RESULT: Two-view abdomen dated 05/21/2012.    Findings: Air is seen within nondilated loops of large and small bowel. A   moderate to large amount of stool is appreciated within the descending   colon and to a lesser extent the ascending colon. Note there also appear   to be findings reflecting a large amount of gastric contents. The  visualized bony skeleton demonstrates no evidence of fracture or   dislocation.    IMPRESSION:   Nonspecific nonobstructive bowel gas pattern with a moderate     to large amount stool.          Verified By: Jani FilesHECTOR W. COOPER, M.D., MD   Assessment/Plan:  Assessment/Plan:   Assessment RLQ pain. Ileitis. Stable.    Plan If stool neg, ok to discharge patient on oral flagyl/cipro for few days. Have patient f/u in office in 1-2 wks. If pain persists, then will arrange colonoscopy to termial ileum. Will sign off. Thanks.   Electronic Signatures: Lutricia Feilh, Caylor Cerino (MD)  (Signed 19-Jul-13 12:46)  Authored: Chief Complaint, VITAL SIGNS/ANCILLARY NOTES, Brief Assessment, Lab Results, Radiology Results, Assessment/Plan   Last Updated: 19-Jul-13 12:46 by Lutricia Feilh, Fareed Fung (MD)

## 2015-02-21 NOTE — Consult Note (Signed)
Brief Consult Note: Diagnosis: RLQ pain/isolated tendeness and abnormal CT without diarrhea-etiology to r/o infectious vs inflammatory etiology.   Patient was seen by consultant.   Consult note dictated.   Recommend further assessment or treatment.   Orders entered.   Comments: Treatment for sinusitis with treatment successful. He developed unsual headache, fever/chill/rigor, upper abd sharp pain, severe anorexia mild nausea/no vomiting, small formed BM yest -no BM today and no diarrhea. Labs with mild leukocytosis, normal lipase and metc,  neg lumbar puncture, abnormal abd CT w contrast showing mild non-specific lymphadenopathy/ileitis, normal appendix. Ate a full lunch w salad 4 hours ago "feels fine". Positive isolated rlq tenderness on exam-no rebound/guarding/peritoneal signs/no n/v/less headache. Plan: 2 way abd film. If stool present may need laxative, stool studies to r/o Yersinia/Campylobacter/C diff-although unusual to have bacterial infection w CT findings and no diarrhea. Labs ordered for am. Continue emperic Vancomycin-just received his IV dose. Will start po vancomycin tomorrow in case this is atypical presentation for C diff. Full liquid diet to rest bowel. May need luminal evaluation with colonoscopy if diagnosis remains unclear. Recommend  surgical consult-ordered already.  This case was d/w Dr. Bluford Kaufmannh in collaboration of care and Dr. Bluford Kaufmannh was in to interview patient and examine him. Further gi recommendations to follow.  Electronic Signatures: Rowan BlaseMills, Johnny Latu Ann (NP)  (Signed [phone]-Jul-13 17:47)  Authored: Brief Consult Note   Last Updated: 18-Jul-13 17:47 by Rowan BlaseMills, Reeta Kuk Ann (NP)

## 2015-02-21 NOTE — Consult Note (Signed)
Brief Consult Note: Diagnosis: headaches, abdominal pain non-surgical etiology, viral mesenteritis.  no signs of appendicitis.   Patient was seen by consultant.   Consult note dictated.   Comments: no indication for laparoscopy or appendectomy.  Electronic Signatures: Natale LayBird, Britiney Blahnik (MD)  (Signed [phone]-Jul-13 20:59)  Authored: Brief Consult Note   Last Updated: 18-Jul-13 20:59 by Natale LayBird, Riverlyn Kizziah (MD)

## 2015-02-21 NOTE — Consult Note (Signed)
PATIENT NAME:  Tristan Mcgee, Tristan Mcgee MR#:  [id number] DATE OF BIRTH:  04-Jun-1981  DATE OF CONSULTATION:  05/21/2012  REFERRING PHYSICIAN:  Theodoro Grist, MD CONSULTING PHYSICIAN:  Verdie Shire, MD / Janalyn Harder. Jerelene Redden, ANP  REASON FOR CONSULTATION: Abnormal abdominal CT scan.   HISTORY OF PRESENT ILLNESS: This 34 year old healthy male patient is followed by ENT for sinusitis and completed five day course of Avelox and four day course of Septra in the last week. He had a pre and post CT done of the sinuses showing resolution of right sinusitis. The patient was at a cookout Monday and had eaten a hamburger, one beer, and [employer] at about 6:30 p.m. He was swimming in a swimming pool. At about 8:30 he developed abrupt high abdomen, sharp type crampy pain. It caused him to get out of the swimming pool the rest of the evening.  He had formed normal stool Tuesday morning. He felt a little nauseated Tuesday, and had severe loss of appetite on Tuesday. He could not tolerate even looking at food. Then a few hours later he was able to eat a little lunch. No vomiting with this. Intermittent pain continued, seemed to be more positional when he leaned forward and improved when he laid back. He was noticing fevers and chills, T-max of 102.5 Tuesday, and experiencing atypical headache. The patient was concerned about systemic infection, presented to a walk-in clinic and was sent to the Emergency Room. The patient did report a mild stiff neck, he had a lumbar puncture attempt and successful studies showing negative findings. He presented with mild leukocytosis, right-sided abdominal tenderness, and a CT study was obtained of the abdomen and pelvis with contrast last evening showing a normal appendix and multiple prominent and mildly enlarged lymph nodes in the right lower quadrant the largest node measuring 1.3 x 1.2 cm. There is mild thickening of the wall of the terminal ileum. The patient has been receiving IV antibiotic therapy with  Zosyn, vancomycin, and Rocephin. Surgical consult has been requested and is pending. Gastroenterology is asked to see the patient regarding further evaluation and management.   This patient reports a normal bowel history, except for C. difficile colitis event in December 2012 after clindamycin for recurrent sinusitis. The patient has had problems with his sinuses dating back a few years ago and had sinus surgery in July 2010. He had sinusitis in December, he had a tonsillectomy in February 2013, and he had stones in the tonsil, very inflamed tonsils, and removal. He had severe diarrhea treated with outpatient vancomycin in December. With the antibiotics he has had this year he has not developed any type of diarrheal illness, or had any symptoms suspicious for C. difficile. He denies travel, sick exposure, swimming in lakes, ocean, or well water. He denies any suspicious food intake. No prior history of colitis, colonoscopy/flexible sigmoidoscopy. No family members with inflammatory bowel disease. Since the patient was hospitalized last night, he says he is feeling some better today, less high fevers, less headache, and abdominal discomfort is less sharp, and is not as diffuse on the upper abdomen now. If he lies back and holds still, he does not notice the discomfort, it is present when he leans forward. The patient consumed a normal lunch including a salad four hours ago and has had no flare of abdominal pain, nausea or vomiting or bowel movement. No bowel movement today. Small undersize bowel movement yesterday morning and then a normal formed stool on Tuesday. He has noted no  diarrhea or blood or melena. The patient has had waxing and waning weight according to dieting, current stable weight.   PAST MEDICAL HISTORY:  1. LPR diagnosed by ENT, on omeprazole. No heartburn or reflux type symptoms.  2. History of tonsillectomy in February 2013 for tonsil stones and inflamed tonsil, per patient report.   3. History of sinusitis, recurrent, sinus surgery in 2010 with bilateral frontal sinusotomies, followed by Dr. Carlis Abbott. Recent sinusitis, antibiotic therapy a week ago.  4. History of cholecystectomy about six years ago, had gallstones and inflamed gallbladder. No history of pancreatitis. 5. LASIK eye surgery.  6. Asthma, quiescent.  7. History of Clostridium difficile colitis treated outpatient with vancomycin in December 2012 thought to be related to clindamycin exposure.  8. Attention deficit disorder.  MEDICATIONS ON ADMISSION:  1. Albuterol available p.r.n. for asthma, not utilizing.  2. Claritin 10 mg daily.  3. Clobestasol 0.05 topical twice daily.  4. Omeprazole 20 mg daily, began January 2013.  5. Singulair 10 mg daily.  6. Ritalin 20 mg twice daily.  ALLERGIES: No known drug allergies. Clindamycin caused C. difficile diarrhea.   HABITS: Social alcohol maybe six beers or wine at the very most monthly, no daily use, no heavy alcohol use. Negative tobacco, tattoos, or illicit drug use.   SOCIAL HISTORY: The patient is married, works as a Financial planner, has two children.   FAMILY HISTORY: Negative for colon cancer, colon polyps, and inflammatory bowel disease.  REVIEW OF SYSTEMS: Ten systems reviewed in detail, positive for fevers and chills, headache, recent sinusitis treatment with CT resolution, and abdominal complaints as noted in the history of present illness. Negative GU complaints. Does have some stiffness in knees, relates it to inactivity. Reports a good appetite at this time.   LABORATORY, DIAGNOSTIC AND RADIOLOGIC DATA: Admission blood work with normal MET-C, liver panel normal including albumin 4.3 and total protein a little elevated at 8.9. CBC with WBC 16.3, hemoglobin 15.4, normocytic indices, and platelet count 167.  Lumbar puncture negative at this time. No organisms.   Urinalysis: Clear. Positive ketones. Negative esterase, nitrite, blood, and bacteria.   CT  scan of the head without contrast 05/20/2012 for follow-up of one week history of sinusitis, headache, and neck pain revealed no acute intracranial process.   CT of the abdomen and pelvis with contrast performed 05/21/2012 with some mild wall thickening of terminal ileum, mildly enlarged lymph nodes in the right lower quadrant, nonspecific, likely reactive, and normal appearance of the appendix.   Fluoroscopy for lumbar puncture performed 05/21/2012 showed successful lumbar puncture with fluoroscopic guidance.   IMPRESSION: This is a healthy young male who has had problems with recurrent sinusitis and has had treatment over the last 10 days with Avelox and Bactrim. He reports a CT study that did show active right sinus infection and subsequent CT has shown resolution. He has developed fevers, chills, and atypical headache. Lumbar puncture was unrevealing. In this timeframe, on Monday night, he developed acute abdominal pain, initially high, epigastric area, difficult for him to describe, crampy sharp pain as well as some dullness, seemed to be localized right side upper and then right lower. He did have anorexia with this, slight nausea, was still able to eat somewhat without vomiting. He tolerated his meal well today. CT shows positive findings on the right side. Etiology to include positive findings of mild lymphadenopathy and mild thickening of the terminal ileum. The differential is likely going to be bacterial versus inflammatory etiology. He certainly  has risks factors for C. difficile colitis, although he reports no diarrhea. He does have no risk factors for Yersinia or Campylobacter, but this needs to be considered in the differential due to the right ileitis focus. The patient seems to be responding on current therapy. He has had three high-powered antibiotics with Rocephin, Zosyn, and vancomycin. Surgical consult is pending.   PLAN: Decrease his diet to full liquid to rest bowel. Check 2-way  abdomen now, he has not had a bowel movement today, small amount yesterday, and we will evaluate to see how much stool is in the colon. We might need to give him MiraLax to get bowels moving to check stool studies. It would be highly unusual to have a bacterial diarrhea that is apparent on a CT scan without actual diarrhea. A.m. labs for sedimentation rate, CRP. Stool studies have already been ordered for comprehensive culture and C. difficile, and would like to get those as soon as possible. Agree with surgical consult. Begin vancomycin orally 250 mg every 6 hours tomorrow for empiric coverage for C. difficile. The patient has received IV vancomycin at the time of this dictation.   Dr. Candace Cruise was consulted, collaborated on care and in to evaluate the patient. We may need to do colonoscopy if the diagnosis remains unclear. Gastroenterology to continue to follow the patient with you. Thank you for this referral.  These services are provided by Joelene Millin A. Jerelene Redden, RN, MS, APRN, [facility], ANP under collaborative agreement with Dr. Verdie Shire.  ____________________________ Janalyn Harder. Jerelene Redden, ANP kam:slb D: 05/21/2012 18:07:02 ET T: 05/22/2012 10:01:46 ET JOB#: [id number]  cc: Joelene Millin A. Jerelene Redden, ANP, <Dictator> Janalyn Harder. Sherlyn Hay, MSN, ANP-BC Adult Nurse Practitioner ELECTRONICALLY SIGNED 05/22/2012 11:27

## 2015-02-21 NOTE — Consult Note (Signed)
PATIENT NAME:  Tristan Mcgee, Tristan Mcgee MR#:  [id number] DATE OF BIRTH:  1980-11-07  DATE OF CONSULTATION:  05/21/2012  CONSULTING PHYSICIAN:  Loraine LericheMark A. Egbert GaribaldiBird, MD  REASON FOR CONSULTATION:  Right lower quadrant abdominal pain and abnormal CT scan.   HISTORY: This is a 34 year old healthy white male who has undergone treatment for recent sinus infection with multiple antibiotics, has a previous history of sinus surgery as well as a tonsillectomy in the spring by Dr. Gertie BaronMadison Clark, who presents to the Emergency Room with a two-day history of severe worsening headache, back pain, and severe right lower quadrant abdominal pain. Work-up in the Emergency Room demonstrated a white count 16,000. He was exquisitely tender in the right lower quadrant. Initially the diagnosis of appendicitis was entertained. Subsequently, a CT scan was obtained which demonstrated a normal-appearing appendix without periappendiceal fat stranding but lymph nodes seen within the mesentery of the right lower quadrant. He was admitted to the Medical Service. Work-up has consisted of a lumbar puncture which demonstrates no signs of acute meningitis. Since his admission, his headache has resolved. His pain has nearly resolved. He is tolerating a regular diet and actually hungry. He did have fever prior to his admission to at least 102. While in the hospital, he has had a fever of 100.5. Surgical Services were asked to consult regarding his abdominal pain. Gastroenterology has also become involved.   ALLERGIES: None.   MEDICATIONS: Well defined in the chart.   PAST MEDICAL/SURGICAL HISTORY:  1. History of cholelithiasis and acute calculus cholecystitis six years ago requiring urgent laparoscopic cholecystectomy.  2. Chronic sinusitis.  3. History of sinus surgery.  4. History of recent tonsillectomy in the spring of this year.   SOCIAL HISTORY: The patient is married and employed.   REVIEW OF SYSTEMS: Review of systems as described above.    FAMILY HISTORY: Noncontributory.   PHYSICAL EXAMINATION:  GENERAL: The patient is alert and oriented, in no obvious distress.   VITAL SIGNS: Temperature is 98.1, pulse 83 and regular, blood pressure is 105/70.   LUNGS: Clear.   HEART: Regular rate and rhythm.   ABDOMEN: Obese, soft and nontender. No obvious hernias. No masses. There is no Rovsing sign. There is very minimal tenderness to deep palpation within the right lower quadrant.   EXTREMITIES: Warm and well perfused.   NECK: Supple. No adenopathy.   LABORATORY, DIAGNOSTIC AND RADIOLOGICAL DATA: Laboratory values are as described above. CT scan is as described above and was personally reviewed on the PACS monitor.   IMPRESSION: Right lower quadrant abdominal pain, does not appear to have appendicitis or surgical etiology. Certainly he could have viral mesenteritis. The etiology of his headache is at this point undetermined.   RECOMMENDATIONS: At present, no surgical plans are underway. There is no indication for appendectomy or diagnostic laparoscopy. We will follow with you in the hospital.  ____________________________ Redge GainerMark A. Egbert GaribaldiBird, MD mab:cbb D: 05/21/2012 21:13:14 ET T: 05/22/2012 10:13:35 ET JOB#: [id number]  cc: Loraine LericheMark A. Egbert GaribaldiBird, MD, <Dictator> Raynald KempMARK A Lyric Hoar MD ELECTRONICALLY SIGNED 05/22/2012 12:34

## 2015-02-21 NOTE — H&P (Signed)
PATIENT NAME:  Tristan Mcgee, Tristan Mcgee MR#:  [id number] DATE OF BIRTH:  05-31-1981  DATE OF ADMISSION:  05/21/2012  PRIMARY CARE PHYSICIAN: Dr. Kerin PernaSpencer Copeland    CHIEF COMPLAINT: Severe headache and abdominal pain.   HISTORY OF PRESENT ILLNESS: Mr. Tristan Mcgee is a 34 year old pleasant Caucasian male with unremarkable past medical history apart from tonsillectomy a few months ago and prior history of sinus surgery. The patient reports that 10 days ago he had sinusitis symptoms treated with antibiotic using Avelox and later it was changed after culture. The antibiotic was converted to Septra. However, his symptoms worsened since Monday, three days ago. His headache is worse. The headache was dull in nature. Severity was 8 on a scale of 10. Earlier today it was ranging between 6 to 8 but now is down to 3 on a scale of 10 after receiving morphine. He had some neck pain as well. He had also fever reaching 102 today. For the last two days he is having abdominal pain described as sharp in nature, 6 on a scale of 10, located at the upper side of the abdomen from the right to the left. However, he is only tender by palpation in the right lower quadrant area. He has no abdominal pain right now but probably it eased off after the morphine. He states that the abdominal pain at the upper part of the abdomen gets a little bit worse when he takes a deep breath or if he bends forward. The pain gets better if he lays down on his back. There is no associated vomiting and no diarrhea.   REVIEW OF SYSTEMS: CONSTITUTIONAL: The patient reported having fever reaching 102. No chills. No fatigue. EYES: No blurring of vision. No double vision. He has some photophobia, however, this is not new for him. ENT: No hearing impairment. No sore throat. No dysphagia. He had sinusitis symptoms in the last 10 days. CARDIOVASCULAR: No chest pain. No shortness of breath. No edema. No syncope. RESPIRATORY: No cough. No chest pain. No shortness of breath.  GASTROINTESTINAL: Abdominal pain as described above. No vomiting. No diarrhea. GENITOURINARY: No dysuria. No frequency of urination. MUSCULOSKELETAL: No joint pain or swelling other than his neck pain earlier. No muscular pain or swelling. INTEGUMENTARY: No skin rash. No ulcers. NEUROLOGY: No focal weakness. No seizure activity but he has a headache as described above. He has no ataxia. PSYCHIATRY: No anxiety. No depression. ENDOCRINE: No polyuria or polydipsia. No heat or cold intolerance.    PAST MEDICAL HISTORY:  1. Tonsillectomy in February of 2013. 2. Sinus surgery in July of 2010. He had bilateral frontal sinusotomies with tissue removal along with bilateral anterior and posterior ethmoidectomies with tissue removal. He also had bilateral maxillary sinus antrostomy with tissue removal and also bilateral sphenoidectomies with tissue removal. He also had septoplasty and also bilateral submucous resection of the inferior turbinates.   3. History of cholecystectomy.  4. Eye LASIK surgery.   FAMILY HISTORY: His father is still alive and he suffers from hypertension. His mother is also alive and she is healthy, has no medical problems. All siblings are healthy.   SOCIAL HISTORY: He is a Sport and exercise psychologistsoftware engineer. He is married, living with his wife.   ADMISSION MEDICATIONS:  1. Albuterol with ipratropium 2 puffs q.4 hours p.r.n. for history of asthma. 2. Claritin 10 mg a day.  3. Clobetasol 0.05% topical used twice a day.  4. Omeprazole 20 mg a day. 5. Singulair 10 mg a day.  6. Methylphenidate 20  mg twice a day.   ALLERGIES: No known drug allergies. He reported clindamycin causing C. difficile diarrhea.   PHYSICAL EXAMINATION:   VITAL SIGNS: Blood pressure 150/80, respiratory rate 15, pulse 72, temperature 99.8, oxygen saturation 96%.   GENERAL APPEARANCE: Young male laying in bed in no acute distress.   HEAD: No pallor. No icterus. No cyanosis.   EARS, NOSE, AND THROAT: Hearing was normal.  Nasal mucosa, lips, tongue were normal.   EYES: Normal iris and conjunctivae. Pupils about 5 mm, equal and reactive to light.   NECK: Supple. No rigidity. No cervical lymphadenopathy. No masses.   HEART: Normal S1, S2. No S3, S4. No murmur. No gallop. No carotid bruits.   RESPIRATORY: Normal breathing pattern without use of accessory muscles. No rales. No wheezing.   ABDOMEN: Soft. There is mild to moderate tenderness in right lower quadrant, not exactly on appendix side or McBurney's point but rather more proximal and more central. There is no rigidity and no rebound. No hepatosplenomegaly. No hernias.   SKIN: No ulcers. No subcutaneous nodules.   MUSCULOSKELETAL: No joint swelling. No clubbing.   NEUROLOGIC: Cranial nerves II through XII are intact. No focal motor deficit. Again, he has no neck rigidity by the time of my examination.   PSYCHIATRY: The patient is alert and oriented x3. Mood and affect were normal.   LABORATORY, DIAGNOSTIC, AND RADIOLOGICAL DATA: CAT scan of the head showed no acute intracranial abnormality.   CAT scan of the abdomen and pelvis with IV contrast mild nonspecific right lower quadrant lymphadenopathy. Normal appendix.   Serum glucose 88, BUN 22, creatinine 1.1, sodium 136, potassium 4.1, total protein 8.9. Normal liver transaminases. CBC showed white count 16,000, hemoglobin 15, hematocrit 47, platelet count 167. Urinalysis was unremarkable.   ASSESSMENT:  1. Severe headache after recent acute sinusitis. Question raised in the Emergency Department about meningitis. Lumbar puncture attempt had failed by the Emergency Department physician. Differential diagnosis will still include headache from severe acute sinusitis.  2. Abdominal pain with CAT scan showing mild nonspecific right lower quadrant lymphadenopathy but normal appendix. This may represent some degree of mesenteric adenitis.  3. Asthma with no exacerbation.  4. History of tonsillectomy. 5. Sinus  surgery.  6. History of cholecystectomy.  7. History of attention deficit disorder.   PLAN:  1. Blood cultures x2 were taken.  2. IV antibiotics started in the Emergency Department, receiving Rocephin, vancomycin, and also Zosyn. I will continue with the Rocephin and vancomycin alone pending results of the lumbar puncture.  3. Schedule the patient to have lumbar puncture under fluoroscopy in a.Mcgee.  4. IV hydration, pain control.  5. I would like to mention that the Emergency Department physician also consulted the surgeon, Dr. Egbert Garibaldi, regarding his abdominal examination and CT findings and he felt it is not an acute abdomen and to continue medical therapy but he can follow-up on the patient's condition after admission to the medical team.  6. Monitor blood pressure as it is slightly elevated.  7. There is a moderate leukocytosis but this could be explained by his sinusitis or mesenteric adenitis.   TIME SPENT EVALUATING THIS PATIENT: More than 55 minutes.   ____________________________ Carney Corners. Rudene Re, MD amd:drc D: 05/21/2012 04:03:27 ET T: 05/21/2012 08:22:51 ET JOB#: [id number]  cc: Carney Corners. Rudene Re, MD, <Dictator> Dr. Kerin Perna  Carney Corners Jael Waldorf MD ELECTRONICALLY SIGNED 05/22/2012 5:00

## 2015-02-26 NOTE — Op Note (Signed)
PATIENT NAME:  Tristan Mcgee, Tristan Mcgee MR#:  [id number] DATE OF BIRTH:  04-12-1981  DATE OF PROCEDURE:  12/26/2011  PREOPERATIVE DIAGNOSIS: Chronic tonsillitis.  POSTOPERATIVE DIAGNOSIS: Chronic tonsillitis.  OPERATION:  Tonsillectomy.  SURGEON:  Zackery BarefootJ. Madison Jayden Rudge, MD  ANESTHESIA:  General endotracheal.  OPERATIVE FINDINGS:  The tonsils were full of purulent tonsilloliths and pus.   DESCRIPTION OF THE PROCEDURE:  [name] was identified in the holding area and taken to the operating room and placed in the supine position.  After general endotracheal anesthesia, the table was turned 45 degrees and the patient was draped in the usual fashion for a tonsillectomy.  A mouth gag was inserted into the oral cavity and examination of the oropharynx showed the uvula was non-bifid.  There was no evidence of submucous cleft to the palate.  There were large tonsils.  Beginning on the left-hand side a tenaculum was used to grasp the tonsil and the Bovie cautery was used to dissect it free from the fossa.  In a similar fashion, the right tonsil was removed.  Meticulous hemostasis was achieved using the Bovie cautery.  With both tonsils removed and no active bleeding, 0.5% plain Marcaine was used to inject the anterior and posterior tonsillar pillars bilaterally.  A total of 8 mL was used.  The patient tolerated the procedure well and was awakened in the operating room and taken to the recovery room in stable condition.   CULTURES:  None. SPECIMENS:  Tonsils. ESTIMATED BLOOD LOSS:  150 mL.  ____________________________ J. Gertie BaronMadison Mitsuru Dault, MD jmc:slb D: 12/26/2011 10:09:22 ET     T: 12/26/2011 10:25:50 ET        JOB#: [id number] Wendee CoppJMADISON Abrham Maslowski MD ELECTRONICALLY SIGNED 01/06/2012 10:03

## 2015-09-20 ENCOUNTER — Encounter (INDEPENDENT_AMBULATORY_CARE_PROVIDER_SITE_OTHER): Payer: Self-pay

## 2015-09-20 ENCOUNTER — Ambulatory Visit (INDEPENDENT_AMBULATORY_CARE_PROVIDER_SITE_OTHER): Payer: BLUE CROSS/BLUE SHIELD | Admitting: Family Medicine

## 2015-09-20 VITALS — BP 130/60 | HR 75 | Temp 98.9°F | Ht 72.0 in | Wt 241.5 lb

## 2015-09-20 DIAGNOSIS — F909 Attention-deficit hyperactivity disorder, unspecified type: Secondary | ICD-10-CM | POA: Diagnosis not present

## 2015-09-20 DIAGNOSIS — S060X0A Concussion without loss of consciousness, initial encounter: Secondary | ICD-10-CM

## 2015-09-20 DIAGNOSIS — I10 Essential (primary) hypertension: Secondary | ICD-10-CM

## 2015-09-20 DIAGNOSIS — F988 Other specified behavioral and emotional disorders with onset usually occurring in childhood and adolescence: Secondary | ICD-10-CM

## 2015-09-20 DIAGNOSIS — Z9884 Bariatric surgery status: Secondary | ICD-10-CM

## 2015-09-20 MED ORDER — AMITRIPTYLINE HCL 25 MG PO TABS: 25.0000 mg | ORAL_TABLET | Freq: Every day | ORAL | Status: DC

## 2015-09-20 MED ORDER — METHYLPHENIDATE HCL ER (OSM) 54 MG PO TBCR: 54.0000 mg | EXTENDED_RELEASE_TABLET | Freq: Every morning | ORAL | Status: DC
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
## 2015-09-20 NOTE — Progress Notes (Signed)
Pre visit review using our clinic review tool, if applicable. No additional management support is needed unless otherwise documented below in the visit note. 

## 2015-09-20 NOTE — Patient Instructions (Signed)

## 2015-09-21 ENCOUNTER — Encounter: Payer: Self-pay | Admitting: Family Medicine

## 2015-09-21 DIAGNOSIS — Z9884 Bariatric surgery status: Secondary | ICD-10-CM | POA: Insufficient documentation

## 2015-09-21 DIAGNOSIS — S060X0A Concussion without loss of consciousness, initial encounter: Secondary | ICD-10-CM

## 2015-09-21 HISTORY — DX: Bariatric surgery status: Z98.84

## 2015-09-21 HISTORY — DX: Concussion without loss of consciousness, initial encounter: S06.0X0A

## 2015-10-25 ENCOUNTER — Other Ambulatory Visit: Payer: Self-pay

## 2015-10-25 MED ORDER — METHYLPHENIDATE HCL ER (OSM) 54 MG PO TBCR: 54.0000 mg | EXTENDED_RELEASE_TABLET | Freq: Every morning | ORAL | Status: DC

## 2015-10-25 NOTE — Telephone Encounter (Signed)
Patient called stating that he needed a refill on Methylphenidate - ok to refill this medication? Thank you!

## 2015-10-25 NOTE — Telephone Encounter (Signed)
[facility] notified prescription is ready to be picked up at the front desk. 

## 2015-11-21 ENCOUNTER — Other Ambulatory Visit: Payer: Self-pay | Admitting: Family Medicine

## 2015-11-21 NOTE — Telephone Encounter (Signed)
Last office visit 09/20/2015.  Trial started on 09/20/2015 to help with headache after concussion.  Ok to continue?

## 2015-11-30 ENCOUNTER — Other Ambulatory Visit: Payer: Self-pay

## 2015-11-30 NOTE — Telephone Encounter (Signed)
Pt left v/m requesting rx methylphenidate. Call when ready for pick up. rx last printed # 30 on 10/25/15. Last seen 09/20/15.

## 2015-12-04 MED ORDER — METHYLPHENIDATE HCL ER (OSM) 54 MG PO TBCR: 54.0000 mg | EXTENDED_RELEASE_TABLET | Freq: Every morning | ORAL | Status: DC

## 2015-12-04 NOTE — Telephone Encounter (Signed)
Tristan Mcgee notified prescription is ready to be picked up at the front desk. 

## 2016-01-05 ENCOUNTER — Other Ambulatory Visit: Payer: Self-pay | Admitting: Family Medicine

## 2016-01-08 MED ORDER — METHYLPHENIDATE HCL ER (OSM) 54 MG PO TBCR: 54.0000 mg | EXTENDED_RELEASE_TABLET | Freq: Every morning | ORAL | Status: DC

## 2016-01-08 NOTE — Telephone Encounter (Addendum)
[name] notified that prescription is ready to be picked up at the front desk.  While on the phone Mr. Tristan Mcgee states the pharmacy told him that this medication needed a prior authorization.  PA completed on CoverMyMeds and approved through 01/07/2017.  [facility] on [address] notified.

## 2016-02-06 ENCOUNTER — Encounter: Payer: Self-pay | Admitting: Family Medicine

## 2016-02-28 ENCOUNTER — Encounter: Payer: Self-pay | Admitting: Family Medicine

## 2016-03-04 ENCOUNTER — Other Ambulatory Visit: Payer: Self-pay | Admitting: Family Medicine

## 2016-03-04 ENCOUNTER — Other Ambulatory Visit (INDEPENDENT_AMBULATORY_CARE_PROVIDER_SITE_OTHER): Payer: BLUE CROSS/BLUE SHIELD

## 2016-03-04 DIAGNOSIS — Z79899 Other long term (current) drug therapy: Secondary | ICD-10-CM | POA: Diagnosis not present

## 2016-03-04 DIAGNOSIS — Z13220 Encounter for screening for lipoid disorders: Secondary | ICD-10-CM

## 2016-03-04 LAB — CBC WITH DIFFERENTIAL/PLATELET
Basophils Absolute: 0 10*3/uL (ref 0.0–0.1)
Basophils Relative: 0.7 % (ref 0.0–3.0)
EOS PCT: 4.2 % (ref 0.0–5.0)
Eosinophils Absolute: 0.3 10*3/uL (ref 0.0–0.7)
HCT: 39.2 % (ref 39.0–52.0)
HEMOGLOBIN: 13.2 g/dL (ref 13.0–17.0)
LYMPHS PCT: 35.3 % (ref 12.0–46.0)
Lymphs Abs: 2.1 10*3/uL (ref 0.7–4.0)
MCHC: 33.7 g/dL (ref 30.0–36.0)
MCV: 86.3 fl (ref 78.0–100.0)
MONO ABS: 0.7 10*3/uL (ref 0.1–1.0)
Monocytes Relative: 12.2 % — ABNORMAL HIGH (ref 3.0–12.0)
Neutro Abs: 2.9 10*3/uL (ref 1.4–7.7)
Neutrophils Relative %: 47.6 % (ref 43.0–77.0)
Platelets: 204 10*3/uL (ref 150.0–400.0)
RBC: 4.54 Mil/uL (ref 4.22–5.81)
RDW: 13.7 % (ref 11.5–15.5)
WBC: 6.1 10*3/uL (ref 4.0–10.5)

## 2016-03-04 LAB — LIPID PANEL
CHOL/HDL RATIO: 3
Cholesterol: 155 mg/dL (ref 0–200)
HDL: 54.1 mg/dL (ref >39.00)
LDL CALC: 81 mg/dL (ref 0–99)
NonHDL: 100.66
TRIGLYCERIDES: 98 mg/dL (ref 0.0–149.0)
VLDL: 19.6 mg/dL (ref 0.0–40.0)

## 2016-03-04 LAB — BASIC METABOLIC PANEL
BUN: 16 mg/dL (ref 6–23)
CO2: 30 mEq/L (ref 19–32)
Calcium: 9.6 mg/dL (ref 8.4–10.5)
Chloride: 105 mEq/L (ref 96–112)
Creatinine, Ser: 0.98 mg/dL (ref 0.40–1.50)
GFR: 93.22 mL/min (ref >60.00)
Glucose, Bld: 83 mg/dL (ref 70–99)
POTASSIUM: 4.4 meq/L (ref 3.5–5.1)
SODIUM: 141 meq/L (ref 135–145)

## 2016-03-04 LAB — HEPATIC FUNCTION PANEL
ALBUMIN: 4.3 g/dL (ref 3.5–5.2)
ALT: 14 U/L (ref 0–53)
AST: 18 U/L (ref 0–37)
Alkaline Phosphatase: 38 U/L — ABNORMAL LOW (ref 39–117)
BILIRUBIN DIRECT: 0.2 mg/dL (ref 0.0–0.3)
TOTAL PROTEIN: 7 g/dL (ref 6.0–8.3)
Total Bilirubin: 1.1 mg/dL (ref 0.2–1.2)

## 2016-03-04 MED ORDER — METHYLPHENIDATE HCL ER (OSM) 54 MG PO TBCR: 54.0000 mg | EXTENDED_RELEASE_TABLET | Freq: Every morning | ORAL | Status: DC

## 2016-03-04 NOTE — Telephone Encounter (Signed)
Pt needs refill on methylphenidate He is going out of town this weekend

## 2016-03-04 NOTE — Telephone Encounter (Signed)
[facility] notified prescription is ready to be picked up at the front desk. 

## 2016-03-04 NOTE — Telephone Encounter (Signed)
Last office visit 09/20/2015.  Last refilled 01/08/2016 for #30 with no refills.  Ok to refill?

## 2016-03-13 ENCOUNTER — Other Ambulatory Visit: Payer: BLUE CROSS/BLUE SHIELD

## 2016-03-20 ENCOUNTER — Ambulatory Visit (INDEPENDENT_AMBULATORY_CARE_PROVIDER_SITE_OTHER): Payer: BLUE CROSS/BLUE SHIELD | Admitting: Family Medicine

## 2016-03-20 ENCOUNTER — Encounter: Payer: Self-pay | Admitting: Family Medicine

## 2016-03-20 VITALS — BP 116/78 | HR 90 | Temp 98.1°F | Ht 72.25 in | Wt 247.8 lb

## 2016-03-20 DIAGNOSIS — Z0000 Encounter for general adult medical examination without abnormal findings: Secondary | ICD-10-CM

## 2016-03-20 MED ORDER — FLUCONAZOLE 200 MG PO TABS: ORAL_TABLET | ORAL | Status: DC

## 2016-03-20 NOTE — Patient Instructions (Signed)
Tinea Versicolor Tinea versicolor is a common fungal infection of the skin. It causes a rash that appears as light or dark patches on the skin. The rash most often occurs on the chest, back, neck, or upper arms. This condition is more common during warm weather. Other than affecting how your skin looks, tinea versicolor usually does not cause other problems. In most cases, the infection goes away in a few weeks with treatment. It may take a few months for the patches on your skin to clear up. CAUSES Tinea versicolor occurs when a type of fungus that is normally present on the skin starts to overgrow. This fungus is a kind of yeast. The exact cause of the overgrowth is not known. This condition cannot be passed from one person to another (noncontagious). RISK FACTORS This condition is more likely to develop when certain factors are present, such as:  Heat and humidity.  Sweating too much.  Hormone changes.  Oily skin.  A weak defense (immune) system. SYMPTOMS Symptoms of this condition may include:  A rash on your skin that is made up of light or dark patches. The rash may have:  Patches of tan or pink spots on light skin.  Patches of white or brown spots on dark skin.  Patches of skin that do not tan.  Well-marked edges.  Scales on the discolored areas.  Mild itching. DIAGNOSIS A health care provider can usually diagnose this condition by looking at your skin. During the exam, he or she may use ultraviolet light to help determine the extent of the infection. In some cases, a skin sample may be taken by scraping the rash. This sample will be viewed under a microscope to check for yeast overgrowth. TREATMENT Treatment for this condition may include:  Dandruff shampoo that is applied to the affected skin during showers or bathing.  Over-the-counter medicated skin cream, lotion, or soaps.  Prescription antifungal medicine in the form of skin cream or pills.  Medicine to help  reduce itching. HOME CARE INSTRUCTIONS  Take medicines only as directed by your health care provider.  Apply dandruff shampoo to the affected area if told to do so by your health care provider. You may be instructed to scrub the affected skin for several minutes each day.  Do not scratch the affected area of skin.  Avoid hot and humid conditions.  Do not use tanning booths.  Try to avoid sweating a lot. SEEK MEDICAL CARE IF:  Your symptoms get worse.  You have a fever.  You have redness, swelling, or pain at the site of your rash.  You have fluid, blood, or pus coming from your rash.  Your rash returns after treatment.   This information is not intended to replace advice given to you by your health care provider. Make sure you discuss any questions you have with your health care provider.   Document Released: 10/18/2000 Document Revised: 11/11/2014 Document Reviewed: 08/02/2014 Elsevier Interactive Patient Education 2016 [employer] Inc[employer]  

## 2016-03-20 NOTE — Progress Notes (Signed)
Pre visit review using our clinic review tool, if applicable. No additional management support is needed unless otherwise documented below in the visit note. 

## 2016-03-20 NOTE — Progress Notes (Signed)
Dr. Frederico Hamman T. Gelena Klosinski, MD, [facility] [place], [address] Phone: [phone] Fax: [phone]  03/20/2016  Patient: Tristan Mcgee, MRN: [id number], DOB: 1981-10-31, 35 y.o.  Primary Physician:  Owens Loffler, MD   Chief Complaint  Patient presents with  . Annual Exam   Subjective:   Tristan Mcgee is a 35 y.o. pleasant patient who presents with the following:  Preventative Health Maintenance Visit:  Health Maintenance Summary Reviewed and updated, unless pt declines services.  Tobacco History Reviewed. Alcohol: No concerns, no excessive use Exercise Habits: Some activity, rec at least 30 mins 5 times a week STD concerns: no risk or activity to increase risk Drug Use: None Encouraged self-testicular check  Lost about 100 pounds  Health Maintenance  Topic Date Due  . HIV Screening  06/21/1997  . TETANUS/TDAP  06/21/2001  . INFLUENZA VACCINE  06/04/2016    There is no immunization history on file for this patient. Patient Active Problem List   Diagnosis Date Noted  . Concussion without loss of consciousness 09/21/2015  . H/O bariatric surgery (SLEEVE) 09/21/2015  . Low back pain 10/20/2012  . ASTHMA NOS W/ACUTE EXACERBATION 11/01/2010  . Hypertension 10/17/2010  . Attention deficit disorder 08/23/2009   Past Medical History  Diagnosis Date  . Asthma   . Allergic rhinitis due to pollen   . ADD (attention deficit disorder)   . Hypertension 10/17/2010  . Concussion without loss of consciousness 09/21/2015  . H/O bariatric surgery (SLEEVE) 09/21/2015   Past Surgical History  Procedure Laterality Date  . Cholecystectomy    . Sleeve gastroplasty  2015   Social History   Social History  . Marital Status: Married    Spouse Name: Tristan Mcgee  . Number of Children: Tristan Mcgee  . Years of Education: Tristan Mcgee   Occupational History  . Financial planner    Social History Main Topics  . Smoking status: Never  Smoker   . Smokeless tobacco: Never Used  . Alcohol Use: 0.0 oz/week    0 Standard drinks or equivalent per week  . Drug Use: No  . Sexual Activity: Not on file   Other Topics Concern  . Not on file   Social History Narrative   Regular exercise-yes   No family history on file. Allergies  Allergen Reactions  . Bactrim [Sulfamethoxazole-Trimethoprim] Other (See Comments)    Joint & Muscle Pain  . Clindamycin/Lincomycin Other (See Comments)    C-Diff Infection    Medication list has been reviewed and updated.   General: Denies fever, chills, sweats. No significant weight loss. Eyes: Denies blurring,significant itching ENT: Denies earache, sore throat, and hoarseness. Cardiovascular: Denies chest pains, palpitations, dyspnea on exertion Respiratory: Denies cough, dyspnea at rest,wheeezing Breast: no concerns about lumps GI: Denies nausea, vomiting, diarrhea, constipation, change in bowel habits, abdominal pain, melena, hematochezia GU: Denies penile discharge, ED, urinary flow / outflow problems. No STD concerns. Musculoskeletal: Denies back pain, joint pain Derm: RASH Neuro: Denies  paresthesias, frequent falls, frequent headaches Psych: Denies depression, anxiety Endocrine: Denies cold intolerance, heat intolerance, polydipsia Heme: Denies enlarged lymph nodes Allergy: No hayfever  Objective:   BP 116/78 mmHg  Pulse 90  Temp(Src) 98.1 F (36.7 C) (Oral)  Ht 6' 0.25" (1.835 m)  Wt 247 lb 12 oz (112.379 kg)  BMI 33.37 kg/m2 Ideal Body Weight: Weight in (lb) to have BMI = 25: 185.2  No exam data present  GEN: well developed, well nourished,  no acute distress Eyes: conjunctiva and lids normal, PERRLA, EOMI ENT: TM clear, nares clear, oral exam WNL Neck: supple, no lymphadenopathy, no thyromegaly, no JVD Pulm: clear to auscultation and percussion, respiratory effort normal CV: regular rate and rhythm, S1-S2, no murmur, rub or gallop, no bruits, peripheral pulses  normal and symmetric, no cyanosis, clubbing, edema or varicosities GI: soft, non-tender; no hepatosplenomegaly, masses; active bowel sounds all quadrants GU: no hernia, testicular mass, penile discharge Lymph: no cervical, axillary or inguinal adenopathy MSK: gait normal, muscle tone and strength WNL, no joint swelling, effusions, discoloration, crepitus  SKIN: UPPER BODY TINEA VERSICOLOR Neuro: normal mental status, normal strength, sensation, and motion Psych: alert; oriented to person, place and time, normally interactive and not anxious or depressed in appearance. All labs reviewed with patient.  Lipids:    Component Value Date/Time   CHOL 155 03/04/2016 0821   TRIG 98.0 03/04/2016 0821   HDL 54.10 03/04/2016 0821   VLDL 19.6 03/04/2016 0821   CHOLHDL 3 03/04/2016 0821   CBC: CBC Latest Ref Rng 03/04/2016 05/22/2012 05/20/2012  WBC 4.0 - 10.5 K/uL 6.1 8.4 16.3(H)  Hemoglobin 13.0 - 17.0 g/dL 13.2 13.6 15.5  Hematocrit 39.0 - 52.0 % 39.2 41.0 47.8  Platelets 150.0 - 400.0 K/uL 204.0 170 099    Basic Metabolic Panel:    Component Value Date/Time   NA 141 03/04/2016 0821   NA 136 05/20/2012 1951   K 4.4 03/04/2016 0821   K 4.1 05/20/2012 1951   CL 105 03/04/2016 0821   CL 100 05/20/2012 1951   CO2 30 03/04/2016 0821   CO2 25 05/20/2012 1951   BUN 16 03/04/2016 0821   BUN 12 05/20/2012 1951   CREATININE 0.98 03/04/2016 0821   CREATININE 1.11 05/20/2012 1951   GLUCOSE 83 03/04/2016 0821   GLUCOSE 88 05/20/2012 1951   CALCIUM 9.6 03/04/2016 0821   CALCIUM 9.4 05/20/2012 1951   Hepatic Function Latest Ref Rng 03/04/2016 05/20/2012  Total Protein 6.0 - 8.3 g/dL 7.0 8.9(H)  Albumin 3.5 - 5.2 g/dL 4.3 4.3  AST 0 - 37 U/L 18 17  ALT 0 - 53 U/L 14 32  Alk Phosphatase 39 - 117 U/L 38(L) 76  Total Bilirubin 0.2 - 1.2 mg/dL 1.1 0.9  Bilirubin, Direct 0.0 - 0.3 mg/dL 0.2 -    No results found for: TSH No results found for: PSA  Assessment and Plan:   Healthcare  maintenance  Health Maintenance Exam: The patient's preventative maintenance and recommended screening tests for an annual wellness exam were reviewed in full today. Brought up to date unless services declined.  Counselled on the importance of diet, exercise, and its role in overall health and mortality. The patient's FH and SH was reviewed, including their home life, tobacco status, and drug and alcohol status.  Tinea versicolor.  Follow-up: No Follow-up on file. Unless noted, follow-up in 1 year for Health Maintenance Exam.  New Prescriptions   FLUCONAZOLE (DIFLUCAN) 200 MG TABLET    1 po now, then repeat in 1 week   Signed,  Ossie Beltran T. Drevin Ortner, MD   Patient's Medications  New Prescriptions   FLUCONAZOLE (DIFLUCAN) 200 MG TABLET    1 po now, then repeat in 1 week  Previous Medications   ALBUTEROL (PROVENTIL HFA;VENTOLIN HFA) 108 (90 BASE) MCG/ACT INHALER    Inhale 2 puffs into the lungs every 6 (six) hours as needed.   LORATADINE 10 MG CAPS    Take 1 capsule by mouth daily.  METHYLPHENIDATE 54 MG PO CR TABLET    Take 1 tablet (54 mg total) by mouth every morning.   MOMETASONE (NASONEX) 50 MCG/ACT NASAL SPRAY    Place 2 sprays into the nose daily.   MONTELUKAST (SINGULAIR) 10 MG TABLET    Take 10 mg by mouth at bedtime.  Modified Medications   No medications on file  Discontinued Medications   AMITRIPTYLINE (ELAVIL) 25 MG TABLET    TAKE 1 TABLET (25 MG TOTAL) BY MOUTH AT BEDTIME.   OMEPRAZOLE (PRILOSEC) 40 MG CAPSULE    Take 40 mg by mouth daily as needed.

## 2016-04-13 ENCOUNTER — Other Ambulatory Visit: Payer: Self-pay | Admitting: Family Medicine

## 2016-04-13 NOTE — Telephone Encounter (Signed)
Last office visit 03/20/2016.  Last refilled 03/04/2016 for #30 with no refills.  Ok to refill?

## 2016-04-15 MED ORDER — METHYLPHENIDATE HCL ER (OSM) 54 MG PO TBCR: 54.0000 mg | EXTENDED_RELEASE_TABLET | Freq: Every morning | ORAL | Status: DC

## 2016-04-15 NOTE — Telephone Encounter (Signed)
Left message for Tristan Mcgee that his prescription is ready to be picked up at the front desk.

## 2016-05-14 ENCOUNTER — Other Ambulatory Visit: Payer: Self-pay | Admitting: Family Medicine

## 2016-05-14 NOTE — Telephone Encounter (Signed)
Last office visit 03/20/2016.  Last refilled 04/15/2016 for #30 with no refills.  Ok to refill?

## 2016-05-15 MED ORDER — METHYLPHENIDATE HCL ER (OSM) 54 MG PO TBCR: 54.0000 mg | EXTENDED_RELEASE_TABLET | Freq: Every morning | ORAL | Status: DC

## 2016-05-15 NOTE — Telephone Encounter (Signed)
Tristan Mcgee notified prescription is ready to be picked up at the front desk. 

## 2016-06-11 ENCOUNTER — Other Ambulatory Visit: Payer: Self-pay | Admitting: Family Medicine

## 2016-06-12 MED ORDER — METHYLPHENIDATE HCL ER (OSM) 54 MG PO TBCR: 54.0000 mg | EXTENDED_RELEASE_TABLET | Freq: Every morning | ORAL | 0 refills | Status: DC

## 2016-06-12 NOTE — Telephone Encounter (Signed)
Last refill 05/15/16 #30 Last office visit 03/20/16

## 2016-06-12 NOTE — Telephone Encounter (Signed)
Patient notified by telephone that script is up front ready for pickup. 

## 2016-07-04 ENCOUNTER — Ambulatory Visit (INDEPENDENT_AMBULATORY_CARE_PROVIDER_SITE_OTHER): Payer: Managed Care, Other (non HMO) | Admitting: Family Medicine

## 2016-07-04 ENCOUNTER — Encounter: Payer: Self-pay | Admitting: Family Medicine

## 2016-07-04 VITALS — BP 110/70 | HR 85 | Temp 98.6°F | Ht 72.25 in | Wt 245.5 lb

## 2016-07-04 DIAGNOSIS — M791 Myalgia: Secondary | ICD-10-CM | POA: Diagnosis not present

## 2016-07-04 DIAGNOSIS — Z8739 Personal history of other diseases of the musculoskeletal system and connective tissue: Secondary | ICD-10-CM

## 2016-07-04 DIAGNOSIS — R5383 Other fatigue: Secondary | ICD-10-CM | POA: Diagnosis not present

## 2016-07-04 LAB — RHEUMATOID FACTOR: Rhuematoid fact SerPl-aCnc: <10 IU/mL (ref <=14)

## 2016-07-04 LAB — SEDIMENTATION RATE: SED RATE: 3 mm/h (ref 0–15)

## 2016-07-04 LAB — HIGH SENSITIVITY CRP: CRP, High Sensitivity: 0.73 mg/L (ref 0.000–5.000)

## 2016-07-04 NOTE — Progress Notes (Signed)
Dr. Karleen Hampshire T. Jodene Polyak, MD, CAQ Sports Medicine Primary [facility] and Sports Medicine [address] [place], [address] Phone: [phone] Fax: [phone]  07/04/2016  Patient: Tristan Mcgee, MRN: [id number], DOB: [birth year], 35 y.o.  Primary Physician:  Hannah Beat, MD   Chief Complaint  Patient presents with  . Fatigue  . Random soreness   Subjective:   Tristan Mcgee is a 35 y.o. very pleasant male patient who presents with the following:  Fatigue off and on since august.  Sleeping generally - 8 hours  Never up past midnight.   Face is sore.  Neck is sore sometimes.   He is having intermittent, random soreness, not provoked by specific activity. He also is generally feeling fatigued and just not feeling all that well.  He denies depression.  Past Medical History, Surgical History, Social History, Family History, Problem List, Medications, and Allergies have been reviewed and updated if relevant.  Patient Active Problem List   Diagnosis Date Noted  . Concussion without loss of consciousness 09/21/2015  . H/O bariatric surgery (SLEEVE) 09/21/2015  . Low back pain 10/20/2012  . ASTHMA NOS W/ACUTE EXACERBATION 11/01/2010  . Hypertension 10/17/2010  . Attention deficit disorder 08/23/2009    Past Medical History:  Diagnosis Date  . ADD (attention deficit disorder)   . Allergic rhinitis due to pollen   . Asthma   . Concussion without loss of consciousness 09/21/2015  . H/O bariatric surgery (SLEEVE) 09/21/2015  . Hypertension 10/17/2010    Past Surgical History:  Procedure Laterality Date  . CHOLECYSTECTOMY    . SLEEVE GASTROPLASTY  2015    Social History   Social History  . Marital status: Married    Spouse name: N/A  . Number of children: N/A  . Years of education: N/A   Occupational History  . Sport and exercise psychologist    Social History Main Topics  . Smoking status: Never Smoker  . Smokeless tobacco: Never Used  . Alcohol use 0.0 oz/week  .  Drug use: No  . Sexual activity: Not on file   Other Topics Concern  . Not on file   Social History Narrative   Regular exercise-yes    No family history on file.  Allergies  Allergen Reactions  . Bactrim [Sulfamethoxazole-Trimethoprim] Other (See Comments)    Joint & Muscle Pain  . Clindamycin/Lincomycin Other (See Comments)    C-Diff Infection    Medication list reviewed and updated in full in [facility].   GEN: No acute illnesses, no fevers, chills. GI: No n/v/d, eating normally Pulm: No SOB Interactive and getting along well at home.  Otherwise, ROS is as per the HPI.  Objective:   BP 110/70   Pulse 85   Temp 98.6 F (37 C) (Oral)   Ht 6' 0.25" (1.835 m)   Wt 245 lb 8 oz (111.4 kg)   BMI 33.07 kg/m   GEN: WDWN, NAD, Non-toxic, A & O x 3 HEENT: Atraumatic, Normocephalic. Neck supple. No masses, No LAD. Ears and Nose: No external deformity. CV: RRR, No M/G/R. No JVD. No thrill. No extra heart sounds. PULM: CTA B, no wheezes, crackles, rhonchi. No retractions. No resp. distress. No accessory muscle use. EXTR: No c/c/e NEURO Normal gait.  PSYCH: Normally interactive. Conversant. Not depressed or anxious appearing.  Calm demeanor.   Laboratory and Imaging Data: Results for orders placed or performed in visit on 07/04/16  ANA  Result Value Ref Range   Anit Nuclear Antibody(ANA) NEG  NEGATIVE  Vitamin B12  Result Value Ref Range   Vitamin B-12 518 211 - 911 pg/mL  Cyclic citrul peptide antibody, IgG  Result Value Ref Range   Cyclic Citrullin Peptide Ab <16<16 Units  Rheumatoid factor  Result Value Ref Range   Rhuematoid fact SerPl-aCnc <10 <=14 IU/mL  High sensitivity CRP  Result Value Ref Range   CRP, High Sensitivity 0.730 0.000 - 5.000 mg/L  Sedimentation rate  Result Value Ref Range   Sed Rate 3 0 - 15 mm/hr  VITAMIN D 25 Hydroxy (Vit-D Deficiency, Fractures)  Result Value Ref Range   VITD 38.18 30.00 - 100.00 ng/mL  TSH  Result Value Ref  Range   TSH 1.86 0.35 - 4.50 uIU/mL     Assessment and Plan:   Other fatigue - Plan: ANA, Vitamin B12, Cyclic citrul peptide antibody, IgG, Rheumatoid factor, High sensitivity CRP, Sedimentation rate, VITAMIN D 25 Hydroxy (Vit-D Deficiency, Fractures), TSH  History of muscle soreness - Plan: ANA, Vitamin B12, Cyclic citrul peptide antibody, IgG, Rheumatoid factor, High sensitivity CRP, Sedimentation rate, VITAMIN D 25 Hydroxy (Vit-D Deficiency, Fractures), TSH  Entire basic workup for the patient's symptoms is negative.  Etiology is unclear.  He is sleeping well.  For now, I recommended that he follow his symptoms, and see if anything becomes more focal.  Follow-up: No Follow-up on file.  Orders Placed This Encounter  Procedures  . ANA  . Vitamin B12  . Cyclic citrul peptide antibody, IgG  . Rheumatoid factor  . High sensitivity CRP  . Sedimentation rate  . VITAMIN D 25 Hydroxy (Vit-D Deficiency, Fractures)  . TSH    Signed,  Karleen HampshireSpencer T. Marshon Bangs, MD   Patient's Medications  New Prescriptions   No medications on file  Previous Medications   ALBUTEROL (PROVENTIL HFA;VENTOLIN HFA) 108 (90 BASE) MCG/ACT INHALER    Inhale 2 puffs into the lungs every 6 (six) hours as needed.   LORATADINE 10 MG CAPS    Take 1 capsule by mouth daily.   METHYLPHENIDATE 54 MG PO CR TABLET    Take 1 tablet (54 mg total) by mouth every morning.   MOMETASONE (NASONEX) 50 MCG/ACT NASAL SPRAY    Place 2 sprays into the nose daily.   MONTELUKAST (SINGULAIR) 10 MG TABLET    Take 10 mg by mouth at bedtime.  Modified Medications   No medications on file  Discontinued Medications   FLUCONAZOLE (DIFLUCAN) 200 MG TABLET    1 po now, then repeat in 1 week

## 2016-07-04 NOTE — Progress Notes (Signed)
Pre visit review using our clinic review tool, if applicable. No additional management support is needed unless otherwise documented below in the visit note. 

## 2016-07-05 LAB — VITAMIN D 25 HYDROXY (VIT D DEFICIENCY, FRACTURES): VITD: 38.18 ng/mL (ref 30.00–100.00)

## 2016-07-05 LAB — VITAMIN B12: VITAMIN B 12: 518 pg/mL (ref 211–911)

## 2016-07-05 LAB — CYCLIC CITRUL PEPTIDE ANTIBODY, IGG

## 2016-07-05 LAB — TSH: TSH: 1.86 u[IU]/mL (ref 0.35–4.50)

## 2016-07-05 LAB — ANA: Anti Nuclear Antibody(ANA): NEGATIVE

## 2016-07-10 ENCOUNTER — Other Ambulatory Visit: Payer: Self-pay | Admitting: Family Medicine

## 2016-07-10 ENCOUNTER — Encounter: Payer: Self-pay | Admitting: Family Medicine

## 2016-07-10 MED ORDER — METHYLPHENIDATE HCL ER (OSM) 54 MG PO TBCR: 54.0000 mg | EXTENDED_RELEASE_TABLET | Freq: Every morning | ORAL | 0 refills | Status: DC

## 2016-07-10 NOTE — Telephone Encounter (Signed)
Last office visit 07/04/2016.  Last refilled 06/12/2016 for #30 with no refills.  Ok to refill?

## 2016-07-10 NOTE — Telephone Encounter (Signed)
[name] notified prescription is ready to be picked up at the front desk. 

## 2016-07-15 ENCOUNTER — Other Ambulatory Visit: Payer: Self-pay | Admitting: Family Medicine

## 2016-07-15 DIAGNOSIS — Z8739 Personal history of other diseases of the musculoskeletal system and connective tissue: Secondary | ICD-10-CM

## 2016-07-15 DIAGNOSIS — M2550 Pain in unspecified joint: Secondary | ICD-10-CM

## 2016-07-15 DIAGNOSIS — R5383 Other fatigue: Secondary | ICD-10-CM

## 2016-07-16 ENCOUNTER — Telehealth: Payer: Self-pay

## 2016-07-16 DIAGNOSIS — R5383 Other fatigue: Secondary | ICD-10-CM

## 2016-07-16 DIAGNOSIS — M2550 Pain in unspecified joint: Secondary | ICD-10-CM

## 2016-07-16 NOTE — Telephone Encounter (Signed)
Pt left /vm; pt has multiple labs due to fatigue scheduled on 07/18/16 at 7:45. Since pt scheduled appt he found out from his brother that his brother has mono. Pt wants to know if he should be tested also for mono. Pt request cb.

## 2016-07-16 NOTE — Telephone Encounter (Signed)
Terri, can you help me at EBV IgG and IgM titers for his upcoming lab appointment. I already ordered them in the computer.     ----- Message from Damita Lackonna S Loring, CMA sent at 07/16/2016 11:30 AM EDT -----      ----- Message from Sharlett IlesEric M Mcgee to Hannah BeatSpencer Naviyah Schaffert, MD sent at 07/16/2016 11:26 AM -----   Dr Patsy Lageropland,    I scheduled my appointment for follow up lab work on Thursday but I got some interesting information since then - I spoke with my brother who apparently had been having some odd symptoms as well. He fainted at work a couple weeks ago and has been having blood tests done as well to no result, until this morning he got a result back that he tested positive for Mononucleosis.    We had a wine tasting / dinner party at my house in late July where I can't say for sure I didn't drink out a glass he used, so it seemed potentially relevant. I left a message with the Triage line at your office as well with this information, just in case it changes anything about Thursday.    Thanks,    Tristan AreolaEric  ----- Message -----  From: Hannah BeatSpencer Grisel Blumenstock, MD  Sent: 07/11/2016 2:22 PM EDT  To: [name]  Subject: RE: Visit Follow-Up Question  Let me think about it for a couple of days and discuss with some of my colleagues. Off the top of my head, checking your testosterone would be a good idea and tick borne illnesses. (very common here)  Give me a couple of days to brainstorm, and I will get them to set up an AM lab draw for you.    Electronically Signed By: Hannah BeatSpencer Sarayu Prevost, MD On: 07/11/2016 2:22 PM       ----- Message -----   From: [name]   Sent: 07/10/2016 2:04 PM EDT    To: Hannah BeatSpencer Larson Limones, MD  Subject: Visit Follow-Up Question    My recent lab work all came back normal, so I just wanted to clarify that I am still experiencing symptoms. What would be the next step to try to determine a cause (or even better, a solution).    Thanks,    [name]    Select Font Size      [name]

## 2016-07-18 ENCOUNTER — Other Ambulatory Visit (INDEPENDENT_AMBULATORY_CARE_PROVIDER_SITE_OTHER): Payer: Managed Care, Other (non HMO)

## 2016-07-18 DIAGNOSIS — M2550 Pain in unspecified joint: Secondary | ICD-10-CM

## 2016-07-18 DIAGNOSIS — Z8739 Personal history of other diseases of the musculoskeletal system and connective tissue: Secondary | ICD-10-CM

## 2016-07-18 DIAGNOSIS — R5383 Other fatigue: Secondary | ICD-10-CM | POA: Diagnosis not present

## 2016-07-18 NOTE — Addendum Note (Signed)
Addended by: Baldomero LamyHAVERS, NATASHA C on: 07/18/2016 08:50 AM   Modules accepted: Orders

## 2016-07-18 NOTE — Addendum Note (Signed)
Addended by: Baldomero LamyHAVERS, Alante Tolan C on: 07/18/2016 09:08 AM   Modules accepted: Orders

## 2016-07-19 LAB — EPSTEIN-BARR VIRUS VCA ANTIBODY PANEL: EBV VCA IgM: <36 U/mL

## 2016-07-19 LAB — LYME AB/WESTERN BLOT REFLEX: B burgdorferi Ab IgG+IgM: <0.9 Index (ref <0.90)

## 2016-07-22 LAB — TESTOS,TOTAL,FREE AND SHBG (FEMALE)
SEX HORMONE BINDING GLOB.: 66 nmol/L — AB (ref 10–50)
TESTOSTERONE,FREE: 69.9 pg/mL (ref 35.0–155.0)
Testosterone,Total,LC/MS/MS: 666 ng/dL (ref 250–1100)

## 2016-07-23 LAB — ROCKY MTN SPOTTED FVR ABS PNL(IGG+IGM)
RMSF IGM: NOT DETECTED
RMSF IgG: NOT DETECTED

## 2016-07-25 LAB — EHRLICHIA ANTIBODY PANEL: E chaffeensis (HGE) Ab, IgG: <1:64 {titer}

## 2016-07-31 ENCOUNTER — Encounter: Payer: Self-pay | Admitting: Family Medicine

## 2016-08-05 ENCOUNTER — Other Ambulatory Visit: Payer: Self-pay | Admitting: Family Medicine

## 2016-08-05 DIAGNOSIS — R5383 Other fatigue: Secondary | ICD-10-CM

## 2016-08-05 DIAGNOSIS — G4710 Hypersomnia, unspecified: Secondary | ICD-10-CM

## 2016-08-05 DIAGNOSIS — G4730 Sleep apnea, unspecified: Secondary | ICD-10-CM

## 2016-08-12 ENCOUNTER — Other Ambulatory Visit: Payer: Self-pay | Admitting: Family Medicine

## 2016-08-12 MED ORDER — METHYLPHENIDATE HCL ER (OSM) 54 MG PO TBCR: 54.0000 mg | EXTENDED_RELEASE_TABLET | Freq: Every morning | ORAL | 0 refills | Status: DC

## 2016-08-12 NOTE — Telephone Encounter (Signed)
Last office visit 07/04/2016.  Last refilled 07/10/16 for #30 with no refills.

## 2016-08-23 ENCOUNTER — Institutional Professional Consult (permissible substitution): Payer: Managed Care, Other (non HMO) | Admitting: Internal Medicine

## 2016-08-23 ENCOUNTER — Encounter: Payer: Self-pay | Admitting: Internal Medicine

## 2016-08-23 ENCOUNTER — Ambulatory Visit (INDEPENDENT_AMBULATORY_CARE_PROVIDER_SITE_OTHER): Payer: Managed Care, Other (non HMO) | Admitting: Internal Medicine

## 2016-08-23 VITALS — BP 132/74 | HR 105 | Ht 73.0 in | Wt 251.0 lb

## 2016-08-23 DIAGNOSIS — G4719 Other hypersomnia: Secondary | ICD-10-CM | POA: Diagnosis not present

## 2016-08-23 NOTE — Patient Instructions (Signed)
Sleep Study needed  Sleep Apnea  Sleep apnea is a sleep disorder characterized by abnormal pauses in breathing while you sleep. When your breathing pauses, the level of oxygen in your blood decreases. This causes you to move out of deep sleep and into light sleep. As a result, your quality of sleep is poor, and the system that carries your blood throughout your body (cardiovascular system) experiences stress. If sleep apnea remains untreated, the following conditions can develop:  High blood pressure (hypertension).  Coronary artery disease.  Inability to achieve or maintain an erection (impotence).  Impairment of your thought process (cognitive dysfunction). There are three types of sleep apnea: 1. Obstructive sleep apnea--Pauses in breathing during sleep because of a blocked airway. 2. Central sleep apnea--Pauses in breathing during sleep because the area of the brain that controls your breathing does not send the correct signals to the muscles that control breathing. 3. Mixed sleep apnea--A combination of both obstructive and central sleep apnea. RISK FACTORS The following risk factors can increase your risk of developing sleep apnea:  Being overweight.  Smoking.  Having narrow passages in your nose and throat.  Being of older age.  Being male.  Alcohol use.  Sedative and tranquilizer use.  Ethnicity. Among individuals younger than 35 years, African Americans are at increased risk of sleep apnea. SYMPTOMS   Difficulty staying asleep.  Daytime sleepiness and fatigue.  Loss of energy.  Irritability.  Loud, heavy snoring.  Morning headaches.  Trouble concentrating.  Forgetfulness.  Decreased interest in sex.  Unexplained sleepiness. DIAGNOSIS  In order to diagnose sleep apnea, your caregiver will perform a physical examination. A sleep study done in the comfort of your own home may be appropriate if you are otherwise healthy. Your caregiver may also recommend  that you spend the night in a sleep lab. In the sleep lab, several monitors record information about your heart, lungs, and brain while you sleep. Your leg and arm movements and blood oxygen level are also recorded. TREATMENT The following actions may help to resolve mild sleep apnea:  Sleeping on your side.   Using a decongestant if you have nasal congestion.   Avoiding the use of depressants, including alcohol, sedatives, and narcotics.   Losing weight and modifying your diet if you are overweight. There also are devices and treatments to help open your airway:  Oral appliances. These are custom-made mouthpieces that shift your lower jaw forward and slightly open your bite. This opens your airway.  Devices that create positive airway pressure. This positive pressure "splints" your airway open to help you breathe better during sleep. The following devices create positive airway pressure:  Continuous positive airway pressure (CPAP) device. The CPAP device creates a continuous level of air pressure with an air pump. The air is delivered to your airway through a mask while you sleep. This continuous pressure keeps your airway open.  Nasal expiratory positive airway pressure (EPAP) device. The EPAP device creates positive air pressure as you exhale. The device consists of single-use valves, which are inserted into each nostril and held in place by adhesive. The valves create very little resistance when you inhale but create much more resistance when you exhale. That increased resistance creates the positive airway pressure. This positive pressure while you exhale keeps your airway open, making it easier to breath when you inhale again.  Bilevel positive airway pressure (BPAP) device. The BPAP device is used mainly in patients with central sleep apnea. This device is similar to  the CPAP device because it also uses an air pump to deliver continuous air pressure through a mask. However, with the  BPAP machine, the pressure is set at two different levels. The pressure when you exhale is lower than the pressure when you inhale.  Surgery. Typically, surgery is only done if you cannot comply with less invasive treatments or if the less invasive treatments do not improve your condition. Surgery involves removing excess tissue in your airway to create a wider passage way.   This information is not intended to replace advice given to you by your health care provider. Make sure you discuss any questions you have with your health care provider.   Document Released: 10/11/2002 Document Revised: 11/11/2014 Document Reviewed: 02/27/2012 Elsevier Interactive Patient Education [employer].

## 2016-08-23 NOTE — Progress Notes (Signed)
[facility]      Date: 08/23/2016,   MRN# [id number] Tristan Mcgee 1980-11-07 Code Status:  Code Status History    This patient does not have a recorded code status. Please follow your organizational policy for patients in this situation.     Hosp day:@LENGTHOFSTAYDAYS @ Referring MD: @ATDPROV @     PCP:      AdmissionWeight: 251 lb (113.9 kg)                 CurrentWeight: 251 lb (113.9 kg) Tristan Ilesric M Acero is a 35 y.o. old male seen in consultation for daytime sleepiness at the request of Dr. Dallas Schimkeopeland.     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 ADD (attention deficit disorder)   . Allergic rhinitis due to pollen   . Asthma   . Concussion without loss of consciousness 09/21/2015  . H/O bariatric surgery (SLEEVE) 09/21/2015  . Hypertension 10/17/2010     SURGICAL HISTORY   Past Surgical History:  Procedure Laterality Date  . CHOLECYSTECTOMY    . SLEEVE GASTROPLASTY  2015     FAMILY HISTORY   Family History  Problem Relation Age of Onset  . Hypertension Father   . Cancer  Maternal Grandmother   . Heart disease Maternal Grandfather   . Colon cancer Paternal Grandmother   . Heart disease Paternal Grandfather      SOCIAL HISTORY   Social History  Substance Use Topics  . Smoking status: Never Smoker  . Smokeless tobacco: Never Used  . Alcohol use 0.0 oz/week     Comment: occ     MEDICATIONS    Home Medication:  Current Outpatient Rx  . Order #: [id number] Class: Historical Med  . Order #: [id number] Class: Historical Med  . Order #: [id number] Class: Print  . Order #: [id number] Class: Historical Med  . Order #: [id number] Class: Historical Med    Current Medication:  Current Outpatient Prescriptions:  .  albuterol (PROVENTIL HFA;VENTOLIN HFA) 108 (90 BASE) MCG/ACT inhaler, Inhale 2 puffs into the lungs every 6 (six) hours as needed., Disp: , Rfl:  .  Loratadine 10 MG CAPS, Take 1 capsule by mouth daily., Disp: , Rfl:  .  methylphenidate 54 MG PO CR tablet, Take 1 tablet (54 mg total) by mouth every morning., Disp: 30 tablet, Rfl: 0 .  mometasone (NASONEX) 50 MCG/ACT nasal spray, Place 2 sprays into the nose daily., Disp: , Rfl:  .  montelukast (SINGULAIR) 10 MG tablet, Take 10 mg by mouth at bedtime., Disp: , Rfl:     ALLERGIES   Bactrim [sulfamethoxazole-trimethoprim] and Clindamycin/lincomycin  REVIEW OF SYSTEMS   Review of Systems  Constitutional: Positive for malaise/fatigue. Negative for chills, diaphoresis, fever and weight loss.  HENT: Negative for congestion and hearing loss.   Eyes: Negative for blurred vision and double vision.  Respiratory: Negative for cough, hemoptysis, sputum production, shortness of breath and wheezing.   Cardiovascular: Negative for chest pain, palpitations and orthopnea.  Gastrointestinal: Negative for abdominal pain, heartburn, nausea and vomiting.  Genitourinary: Negative for dysuria and urgency.  Musculoskeletal: Negative for back pain, myalgias and neck pain.  Skin: Negative for rash.  Neurological:  Negative for dizziness, tingling, tremors, weakness and headaches.  Endo/Heme/Allergies: Does not bruise/bleed easily.  Psychiatric/Behavioral: Negative for depression, substance abuse and suicidal ideas.  All other systems reviewed and are negative.    VS: BP 132/74 (BP Location: Left Arm, Cuff Size: Normal)   Pulse (!) 105   Ht 6\' 1"  (1.854 m)   Wt 251 lb (113.9 kg)   SpO2 99%   BMI 33.12 kg/m      PHYSICAL EXAM  Physical Exam  Constitutional: He is oriented to person, place, and time. He appears well-developed and well-nourished. No distress.  HENT:  Head: Normocephalic and atraumatic.  Mouth/Throat: No oropharyngeal exudate.  Eyes: EOM are normal. Pupils are equal, round, and reactive to light. No scleral icterus.  Neck: Normal range of motion. Neck supple.  Cardiovascular: Normal rate, regular rhythm and normal heart sounds.   No murmur heard. Pulmonary/Chest: No stridor. No respiratory distress. He has no wheezes.  Abdominal: Soft. Bowel sounds are normal.  Musculoskeletal: Normal range of motion. He exhibits no edema.  Neurological: He is alert and oriented to person, place, and time. He displays normal reflexes. Coordination normal.  Skin: Skin is warm. He is not diaphoretic.  Psychiatric: He has a normal mood and affect.       ASSESSMENT/PLAN   35 yo white male with excessive fatigue and daytime sleepiness, will need to assess for OSA  Will order Sleep study Use albuterol as needed  Follow up after sleep study completed  I have personally obtained a history, examined the patient, evaluated laboratory and independently reviewed imaging results, formulated the assessment and plan and placed orders.  The Patient requires high complexity decision making for assessment and support, frequent evaluation and titration of therapies, application of advanced monitoring technologies and extensive interpretation of multiple databases.   Patient satisfied with Plan of  action and management. All questions answered  Lucie Leather, M.D.  [facility]  Medical Director [facility] Medical Director [facility] Cardio-Pulmonary Department

## 2016-08-29 ENCOUNTER — Telehealth: Payer: Self-pay | Admitting: Internal Medicine

## 2016-08-29 DIAGNOSIS — G4719 Other hypersomnia: Secondary | ICD-10-CM

## 2016-08-29 NOTE — Telephone Encounter (Signed)
Ok, proceed with home sleep study

## 2016-08-29 NOTE — Telephone Encounter (Signed)
Tristan Mcgee with [facility] called and stated that pt's insurance will not cover an in lab study without a peer to peer. [name] prefers a Home Sleep Study.  If this is ok, please cancel the order for the Split Night and place order for HST.  Please advise. [name]

## 2016-08-29 NOTE — Telephone Encounter (Signed)
Order placed for HST. Nothing further needed. ?

## 2016-09-10 DIAGNOSIS — G4710 Hypersomnia, unspecified: Secondary | ICD-10-CM | POA: Diagnosis not present

## 2016-09-16 ENCOUNTER — Other Ambulatory Visit: Payer: Self-pay | Admitting: Family Medicine

## 2016-09-16 ENCOUNTER — Telehealth: Payer: Self-pay | Admitting: *Deleted

## 2016-09-16 MED ORDER — METHYLPHENIDATE HCL ER (OSM) 54 MG PO TBCR: 54.0000 mg | EXTENDED_RELEASE_TABLET | Freq: Every morning | ORAL | 0 refills | Status: DC

## 2016-09-16 NOTE — Telephone Encounter (Signed)
Last office visit  07/04/2016.  Last refilled 08/12/2016 for #30 with no refills.  Ok to refill?

## 2016-09-16 NOTE — Telephone Encounter (Signed)
Dalante notified prescription is ready to be picked up at the front desk.

## 2016-09-16 NOTE — Telephone Encounter (Signed)
Pt informed of sleep study results which were negative. Nothing further needed.

## 2016-09-25 ENCOUNTER — Other Ambulatory Visit: Payer: Self-pay

## 2016-09-25 DIAGNOSIS — G4719 Other hypersomnia: Secondary | ICD-10-CM

## 2016-10-08 ENCOUNTER — Encounter: Payer: Self-pay | Admitting: Family Medicine

## 2016-10-08 ENCOUNTER — Ambulatory Visit (INDEPENDENT_AMBULATORY_CARE_PROVIDER_SITE_OTHER): Payer: Managed Care, Other (non HMO) | Admitting: Family Medicine

## 2016-10-08 VITALS — BP 102/78 | HR 72 | Temp 98.5°F | Wt 256.0 lb

## 2016-10-08 DIAGNOSIS — M67449 Ganglion, unspecified hand: Secondary | ICD-10-CM | POA: Diagnosis not present

## 2016-10-08 NOTE — Progress Notes (Signed)
Pre visit review using our clinic review tool, if applicable. No additional management support is needed unless otherwise documented below in the visit note. 

## 2016-10-08 NOTE — Patient Instructions (Signed)
Use ibuprofen, ice to decrease pain and swelling.  If not improving in several weeks.Marland Kitchen.Marland Kitchen.Call for referral to hand specialist.

## 2016-10-08 NOTE — Progress Notes (Signed)
   Subjective:    Patient ID: Tristan Mcgee, male    DOB: 07-15-1981, 35 y.o.   MRN: [id number]  HPI  35 year old male presents with new onset lump in left thumb in past few weeks.  off and on tender if hit, redness. Increasing in size some.  No pain in moving thumb.  No known injury.  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  Always noted in past thumb joint click and pop.. Especially when dehydrated.  Using ibuprofen off and on   HX: no past history of joint issue. Had rheum work up in past as well. Review of Systems  Constitutional: Negative for fatigue and fever.  HENT: Negative for ear pain.   Eyes: Negative for pain.  Respiratory: Negative for shortness of breath.   Cardiovascular: Negative for chest pain.       Objective:   Physical Exam  Constitutional: Vital signs are normal. He appears well-developed and well-nourished.  HENT:  Head: Normocephalic.  Right Ear: Hearing normal.  Left Ear: Hearing normal.  Nose: Nose normal.  Mouth/Throat: Oropharynx is clear and moist and mucous membranes are normal.  Neck: Trachea normal. Carotid bruit is not present. No thyroid mass and no thyromegaly present.  Cardiovascular: Normal rate, regular rhythm and normal pulses.  Exam reveals no gallop, no distant heart sounds and no friction rub.   No murmur heard. No peripheral edema  Pulmonary/Chest: Effort normal and breath sounds normal. No respiratory distress.  Musculoskeletal:   Slightly red nodule at base of nail edge on left thumb  full ROM of CMC, IP joint of left thumb, no triggering  Skin: Skin is warm, dry and intact. No rash noted.  Psychiatric: He has a normal mood and affect. His speech is normal and behavior is normal. Thought content normal.          Assessment & Plan:

## 2016-10-18 ENCOUNTER — Other Ambulatory Visit: Payer: Self-pay | Admitting: Family Medicine

## 2016-10-18 NOTE — Telephone Encounter (Signed)
Last office visit 10/08/2016 for thumb pain.  Last refilled 09/16/2016 for #30.  Ok to refill?

## 2016-10-21 MED ORDER — METHYLPHENIDATE HCL ER (OSM) 54 MG PO TBCR: 54.0000 mg | EXTENDED_RELEASE_TABLET | Freq: Every morning | ORAL | 0 refills | Status: DC

## 2016-10-21 NOTE — Telephone Encounter (Signed)
Tristan Mcgee notified that his prescription is ready to be picked up at the front desk.

## 2016-11-22 ENCOUNTER — Other Ambulatory Visit: Payer: Self-pay | Admitting: Family Medicine

## 2016-11-22 MED ORDER — METHYLPHENIDATE HCL ER (OSM) 54 MG PO TBCR: 54.0000 mg | EXTENDED_RELEASE_TABLET | Freq: Every morning | ORAL | 0 refills | Status: DC

## 2016-11-22 NOTE — Telephone Encounter (Signed)
Tristan Mcgee notified that his prescription is ready to be picked up at the front desk.

## 2016-11-22 NOTE — Telephone Encounter (Signed)
Please refill #30  Print for Dr. B if possible

## 2016-11-22 NOTE — Telephone Encounter (Signed)
Last office visit 10/08/2016 with Dr. Ermalene SearingBedsole.  Last refilled 10/21/2016 for #30 with no refills.  Ok to refill?

## 2016-12-08 DIAGNOSIS — M67449 Ganglion, unspecified hand: Secondary | ICD-10-CM | POA: Insufficient documentation

## 2016-12-08 NOTE — Assessment & Plan Note (Signed)
Use ibuprofen, ice to decrease pain and swelling.  If not improving in several weeks...Call for referral to hand specialist. 

## 2016-12-24 ENCOUNTER — Other Ambulatory Visit: Payer: Self-pay | Admitting: Family Medicine

## 2016-12-25 MED ORDER — METHYLPHENIDATE HCL ER (OSM) 54 MG PO TBCR: 54.0000 mg | EXTENDED_RELEASE_TABLET | Freq: Every morning | ORAL | 0 refills | Status: DC

## 2016-12-25 NOTE — Telephone Encounter (Signed)
Last office visit 10/08/2016.  Last refill 11/22/2016 for #30 with no refills.  Ok to refill?

## 2016-12-25 NOTE — Telephone Encounter (Signed)
Tristan Mcgee notified by telephone that his prescription is ready to be picked up at the front desk.

## 2016-12-31 ENCOUNTER — Encounter: Payer: Self-pay | Admitting: Family Medicine

## 2016-12-31 ENCOUNTER — Other Ambulatory Visit: Payer: Self-pay | Admitting: Family Medicine

## 2016-12-31 MED ORDER — FLUCONAZOLE 200 MG PO TABS: ORAL_TABLET | ORAL | 0 refills | Status: DC

## 2017-01-21 ENCOUNTER — Other Ambulatory Visit: Payer: Self-pay | Admitting: Family Medicine

## 2017-01-21 NOTE — Telephone Encounter (Signed)
Last office visit 10/08/2016 with Dr. Ermalene SearingBedsole.  Last refilled 12/25/2016 for #30 with no refills.  Ok to refill?

## 2017-01-22 MED ORDER — METHYLPHENIDATE HCL ER (OSM) 54 MG PO TBCR: 54.0000 mg | EXTENDED_RELEASE_TABLET | Freq: Every morning | ORAL | 0 refills | Status: DC

## 2017-03-03 ENCOUNTER — Other Ambulatory Visit: Payer: Self-pay | Admitting: Family Medicine

## 2017-03-03 MED ORDER — METHYLPHENIDATE HCL ER (OSM) 54 MG PO TBCR: 54.0000 mg | EXTENDED_RELEASE_TABLET | Freq: Every morning | ORAL | 0 refills | Status: DC

## 2017-03-03 NOTE — Telephone Encounter (Signed)
Last office visit 10/08/2016.  Last refilled 01/22/2017 for #30 with no refills.  Ok to refill?

## 2017-03-05 ENCOUNTER — Encounter: Payer: Self-pay | Admitting: Family Medicine

## 2017-04-01 ENCOUNTER — Other Ambulatory Visit: Payer: Self-pay | Admitting: Family Medicine

## 2017-04-01 NOTE — Telephone Encounter (Signed)
Last office visit 10/08/2016 with Dr. Ermalene SearingBedsole.  Last refilled 03/03/2017 for #30 with no refills.  Ok to refill?

## 2017-04-02 ENCOUNTER — Encounter: Payer: Self-pay | Admitting: Family Medicine

## 2017-04-02 MED ORDER — METHYLPHENIDATE HCL ER (OSM) 54 MG PO TBCR: 54.0000 mg | EXTENDED_RELEASE_TABLET | Freq: Every morning | ORAL | 0 refills | Status: DC

## 2017-04-07 ENCOUNTER — Telehealth: Payer: Self-pay | Admitting: *Deleted

## 2017-04-07 NOTE — Telephone Encounter (Signed)
Received [facility] from [facility] requesting [facility] for Concerta 5 mg.  PA completed on CoverMyMeds.  [id number]. CONCERTA TAB 54MG  is approved through 04/07/2018. [facility] notified of approval via fax.

## 2017-05-02 ENCOUNTER — Other Ambulatory Visit: Payer: Self-pay | Admitting: Family Medicine

## 2017-05-04 NOTE — Telephone Encounter (Signed)
Last office visit 10/08/16 with Dr. Ermalene SearingBedsole.  Last CPE 03/2016.  Last refilled 04/02/17 for #30 with no refills.  Ok to refill?

## 2017-05-05 MED ORDER — METHYLPHENIDATE HCL ER (OSM) 54 MG PO TBCR: 54.0000 mg | EXTENDED_RELEASE_TABLET | Freq: Every morning | ORAL | 0 refills | Status: DC

## 2017-05-09 ENCOUNTER — Encounter: Payer: Self-pay | Admitting: Family Medicine

## 2017-05-09 NOTE — Telephone Encounter (Signed)
Here is that case we were talking about. The question is will his insurance cover the generic methylphenidate that he was previously on?

## 2017-05-09 NOTE — Telephone Encounter (Signed)
Spoke with insurance and they pay for the Brand Name only.They said we could attempt to do a Prior Authorization for a formulary exemption to try and get the generic approved.  PA completed for Non-formulary exemtion for generic Concerta and faxed to [facility] at [phone].  Awaiting decision.  Tristan Mcgee has been notified and will let him know the decision as soon as I hear back from the insurance company.

## 2017-05-14 NOTE — Telephone Encounter (Signed)
PA approved for formulary exemption  Methylphenidate ER until 05/09/2018.  Tristan Mcgee notified of approval via MyChart.

## 2017-06-09 ENCOUNTER — Other Ambulatory Visit: Payer: Self-pay | Admitting: Family Medicine

## 2017-06-09 MED ORDER — METHYLPHENIDATE HCL ER (OSM) 54 MG PO TBCR: 54.0000 mg | EXTENDED_RELEASE_TABLET | Freq: Every morning | ORAL | 0 refills | Status: DC

## 2017-06-09 MED ORDER — FLUCONAZOLE 200 MG PO TABS: ORAL_TABLET | ORAL | 0 refills | Status: DC

## 2017-06-09 NOTE — Telephone Encounter (Signed)
Last office visit 10/08/2016.  Last refilled 05/05/2017 for #30 with no refills.  See note about Diflucan.

## 2017-07-09 ENCOUNTER — Other Ambulatory Visit: Payer: Self-pay | Admitting: Family Medicine

## 2017-07-09 MED ORDER — METHYLPHENIDATE HCL ER (OSM) 54 MG PO TBCR: 54.0000 mg | EXTENDED_RELEASE_TABLET | Freq: Every morning | ORAL | 0 refills | Status: DC

## 2017-07-09 NOTE — Telephone Encounter (Signed)
Last office visit 10/08/2016 with Dr. Ermalene SearingBedsole.  Last visit with PCP 07/04/2016.  Last refilled 06/09/2017 for #30 with no refills.  No future appointments.  Refill?

## 2017-07-31 ENCOUNTER — Encounter: Payer: Self-pay | Admitting: Family Medicine

## 2017-07-31 ENCOUNTER — Ambulatory Visit (INDEPENDENT_AMBULATORY_CARE_PROVIDER_SITE_OTHER): Payer: 59 | Admitting: Family Medicine

## 2017-07-31 VITALS — BP 108/70 | HR 81 | Temp 98.5°F | Ht 71.5 in | Wt 256.5 lb

## 2017-07-31 DIAGNOSIS — Z114 Encounter for screening for human immunodeficiency virus [HIV]: Secondary | ICD-10-CM | POA: Diagnosis not present

## 2017-07-31 DIAGNOSIS — Z23 Encounter for immunization: Secondary | ICD-10-CM | POA: Diagnosis not present

## 2017-07-31 DIAGNOSIS — Z0000 Encounter for general adult medical examination without abnormal findings: Secondary | ICD-10-CM

## 2017-07-31 LAB — CBC WITH DIFFERENTIAL/PLATELET
BASOS PCT: 0.6 % (ref 0.0–3.0)
Basophils Absolute: 0.1 10*3/uL (ref 0.0–0.1)
EOS PCT: 1.4 % (ref 0.0–5.0)
Eosinophils Absolute: 0.2 10*3/uL (ref 0.0–0.7)
HCT: 44.4 % (ref 39.0–52.0)
HEMOGLOBIN: 14.5 g/dL (ref 13.0–17.0)
Lymphocytes Relative: 12.3 % (ref 12.0–46.0)
Lymphs Abs: 1.5 10*3/uL (ref 0.7–4.0)
MCHC: 32.6 g/dL (ref 30.0–36.0)
MCV: 89.1 fl (ref 78.0–100.0)
MONO ABS: 0.9 10*3/uL (ref 0.1–1.0)
Monocytes Relative: 7.6 % (ref 3.0–12.0)
Neutro Abs: 9.3 10*3/uL — ABNORMAL HIGH (ref 1.4–7.7)
Neutrophils Relative %: 78.1 % — ABNORMAL HIGH (ref 43.0–77.0)
Platelets: 260 10*3/uL (ref 150.0–400.0)
RBC: 4.98 Mil/uL (ref 4.22–5.81)
RDW: 13.4 % (ref 11.5–15.5)
WBC: 12 10*3/uL — AB (ref 4.0–10.5)

## 2017-07-31 LAB — LIPID PANEL
CHOLESTEROL: 199 mg/dL (ref 0–200)
HDL: 54.4 mg/dL (ref >39.00)
LDL CALC: 121 mg/dL — AB (ref 0–99)
NonHDL: 144.53
TRIGLYCERIDES: 119 mg/dL (ref 0.0–149.0)
Total CHOL/HDL Ratio: 4
VLDL: 23.8 mg/dL (ref 0.0–40.0)

## 2017-07-31 LAB — HEPATIC FUNCTION PANEL
ALBUMIN: 4.6 g/dL (ref 3.5–5.2)
ALT: 21 U/L (ref 0–53)
AST: 25 U/L (ref 0–37)
Alkaline Phosphatase: 32 U/L — ABNORMAL LOW (ref 39–117)
BILIRUBIN TOTAL: 0.7 mg/dL (ref 0.2–1.2)
Bilirubin, Direct: 0.1 mg/dL (ref 0.0–0.3)
TOTAL PROTEIN: 7.3 g/dL (ref 6.0–8.3)

## 2017-07-31 LAB — BASIC METABOLIC PANEL
BUN: 20 mg/dL (ref 6–23)
CO2: 29 mEq/L (ref 19–32)
Calcium: 9.7 mg/dL (ref 8.4–10.5)
Chloride: 102 mEq/L (ref 96–112)
Creatinine, Ser: 1.27 mg/dL (ref 0.40–1.50)
GFR: 68.55 mL/min (ref >60.00)
GLUCOSE: 89 mg/dL (ref 70–99)
POTASSIUM: 4.3 meq/L (ref 3.5–5.1)
Sodium: 136 mEq/L (ref 135–145)

## 2017-07-31 MED ORDER — METHYLPHENIDATE HCL ER (OSM) 54 MG PO TBCR: 54.0000 mg | EXTENDED_RELEASE_TABLET | Freq: Every morning | ORAL | 0 refills | Status: DC

## 2017-07-31 NOTE — Progress Notes (Signed)
Dr. Frederico Hamman T. Kiyomi Pallo, MD, [facility] [place], [address] Phone: [phone] Fax: [phone]  07/31/2017  Patient: Tristan Mcgee, MRN: [id number], DOB: 01/15/1981, 36 y.o.  Primary Physician:  Owens Loffler, MD   Chief Complaint  Patient presents with  . Annual Exam   Subjective:   Tristan Mcgee is a 36 y.o. pleasant patient who presents with the following:  Preventative Health Maintenance Visit:  Health Maintenance Summary Reviewed and updated, unless pt declines services.  Doing well, still working out a lot ADD meds still  Doing well  Tobacco History Reviewed. Alcohol: No concerns, no excessive use Exercise Habits: Some activity, rec at least 30 mins 5 times a week STD concerns: no risk or activity to increase risk Drug Use: None Encouraged self-testicular check  Health Maintenance  Topic Date Due  . HIV Screening  06/21/1997  . TETANUS/TDAP  05/16/2026  . INFLUENZA VACCINE  Completed   Immunization History  Administered Date(s) Administered  . Influenza,inj,Quad PF,6+ Mos 07/31/2017  . Tdap 05/16/2016   Patient Active Problem List   Diagnosis Date Noted  . Mucous cyst of finger 12/08/2016  . Concussion without loss of consciousness 09/21/2015  . H/O bariatric surgery (SLEEVE) 09/21/2015  . Low back pain 10/20/2012  . ASTHMA NOS W/ACUTE EXACERBATION 11/01/2010  . Hypertension 10/17/2010  . Attention deficit disorder 08/23/2009   Past Medical History:  Diagnosis Date  . ADD (attention deficit disorder)   . Allergic rhinitis due to pollen   . Asthma   . Concussion without loss of consciousness 09/21/2015  . H/O bariatric surgery (SLEEVE) 09/21/2015  . Hypertension 10/17/2010   Past Surgical History:  Procedure Laterality Date  . CHOLECYSTECTOMY    . SLEEVE GASTROPLASTY  2015   Social History   Social History  . Marital status: Married    Spouse name: N/A  . Number of  children: N/A  . Years of education: N/A   Occupational History  . Financial planner    Social History Main Topics  . Smoking status: Never Smoker  . Smokeless tobacco: Never Used  . Alcohol use 0.0 oz/week     Comment: occ  . Drug use: No  . Sexual activity: Not on file   Other Topics Concern  . Not on file   Social History Narrative   Regular exercise-yes   Family History  Problem Relation Age of Onset  . Hypertension Father   . Cancer Maternal Grandmother   . Heart disease Maternal Grandfather   . Colon cancer Paternal Grandmother   . Heart disease Paternal Grandfather    Allergies  Allergen Reactions  . Bactrim [Sulfamethoxazole-Trimethoprim] Other (See Comments)    Joint & Muscle Pain  . Clindamycin/Lincomycin Other (See Comments)    C-Diff Infection    Medication list has been reviewed and updated.   General: Denies fever, chills, sweats. No significant weight loss. Eyes: Denies blurring,significant itching ENT: Denies earache, sore throat, and hoarseness. Cardiovascular: Denies chest pains, palpitations, dyspnea on exertion Respiratory: Denies cough, dyspnea at rest,wheeezing Breast: no concerns about lumps GI: Denies nausea, vomiting, diarrhea, constipation, change in bowel habits, abdominal pain, melena, hematochezia GU: Denies penile discharge, ED, urinary flow / outflow problems. No STD concerns. Musculoskeletal: Denies back pain, joint pain Derm: Denies rash, itching Neuro: Denies  paresthesias, frequent falls, frequent headaches Psych: Denies depression, anxiety Endocrine: Denies cold intolerance, heat intolerance, polydipsia Heme: Denies enlarged lymph nodes Allergy: No hayfever  Objective:   BP 108/70   Pulse 81   Temp 98.5 F (36.9 C) (Oral)   Ht 5' 11.5" (1.816 m)   Wt 256 lb 8 oz (116.3 kg)   BMI 35.28 kg/m  Ideal Body Weight: Weight in (lb) to have BMI = 25: 181.4  No exam data present  GEN: well developed, well nourished, no  acute distress Eyes: conjunctiva and lids normal, PERRLA, EOMI ENT: TM clear, nares clear, oral exam WNL Neck: supple, no lymphadenopathy, no thyromegaly, no JVD Pulm: clear to auscultation and percussion, respiratory effort normal CV: regular rate and rhythm, S1-S2, no murmur, rub or gallop, no bruits, peripheral pulses normal and symmetric, no cyanosis, clubbing, edema or varicosities GI: soft, non-tender; no hepatosplenomegaly, masses; active bowel sounds all quadrants GU: no hernia, testicular mass, penile discharge Lymph: no cervical, axillary or inguinal adenopathy MSK: gait normal, muscle tone and strength WNL, no joint swelling, effusions, discoloration, crepitus  SKIN: clear, good turgor, color WNL, no rashes, lesions, or ulcerations Neuro: normal mental status, normal strength, sensation, and motion Psych: alert; oriented to person, place and time, normally interactive and not anxious or depressed in appearance. All labs reviewed with patient.  Lipids:    Component Value Date/Time   CHOL 199 07/31/2017 1532   TRIG 119.0 07/31/2017 1532   HDL 54.40 07/31/2017 1532   VLDL 23.8 07/31/2017 1532   CHOLHDL 4 07/31/2017 1532   CBC: CBC Latest Ref Rng & Units 07/31/2017 03/04/2016 05/22/2012  WBC 4.0 - 10.5 K/uL 12.0(H) 6.1 8.4  Hemoglobin 13.0 - 17.0 g/dL 14.5 13.2 13.6  Hematocrit 39.0 - 52.0 % 44.4 39.2 41.0  Platelets 150.0 - 400.0 K/uL 260.0 204.0 712    Basic Metabolic Panel:    Component Value Date/Time   NA 136 07/31/2017 1532   NA 136 05/20/2012 1951   K 4.3 07/31/2017 1532   K 4.1 05/20/2012 1951   CL 102 07/31/2017 1532   CL 100 05/20/2012 1951   CO2 29 07/31/2017 1532   CO2 25 05/20/2012 1951   BUN 20 07/31/2017 1532   BUN 12 05/20/2012 1951   CREATININE 1.27 07/31/2017 1532   CREATININE 1.11 05/20/2012 1951   GLUCOSE 89 07/31/2017 1532   GLUCOSE 88 05/20/2012 1951   CALCIUM 9.7 07/31/2017 1532   CALCIUM 9.4 05/20/2012 1951   Hepatic Function Latest Ref  Rng & Units 07/31/2017 03/04/2016 05/20/2012  Total Protein 6.0 - 8.3 g/dL 7.3 7.0 8.9(H)  Albumin 3.5 - 5.2 g/dL 4.6 4.3 4.3  AST 0 - 37 U/L 25 18 17   ALT 0 - 53 U/L 21 14 32  Alk Phosphatase 39 - 117 U/L 32(L) 38(L) 76  Total Bilirubin 0.2 - 1.2 mg/dL 0.7 1.1 0.9  Bilirubin, Direct 0.0 - 0.3 mg/dL 0.1 0.2 -    Lab Results  Component Value Date   TSH 1.86 07/04/2016   No results found for: PSA  Assessment and Plan:   Healthcare maintenance - Plan: Basic metabolic panel, CBC with Differential/Platelet, Hepatic function panel, Lipid panel, HIV antibody  Screening for HIV (human immunodeficiency virus) - Plan: HIV antibody  Need for influenza vaccination - Plan: Flu Vaccine QUAD 36+ mos IM  Health Maintenance Exam: The patient's preventative maintenance and recommended screening tests for an annual wellness exam were reviewed in full today. Brought up to date unless services declined.  Counselled on the importance of diet, exercise, and its role in overall health and mortality. The patient's FH and SH was reviewed, including their  home life, tobacco status, and drug and alcohol status.  Follow-up in 1 year for physical exam or additional follow-up below.  Follow-up: Return in about 6 months (around 01/28/2018). Or follow-up in 1 year if not noted.  Meds ordered this encounter  Medications  . cetirizine (ZYRTEC) 10 MG tablet    Sig: Take 10 mg by mouth daily.  Marland Kitchen DISCONTD: methylphenidate 54 MG PO CR tablet    Sig: Take 1 tablet (54 mg total) by mouth every morning.    Dispense:  30 tablet    Refill:  0  . DISCONTD: methylphenidate 54 MG PO CR tablet    Sig: Take 1 tablet (54 mg total) by mouth every morning. Do not fill until 08/30/2017    Dispense:  30 tablet    Refill:  0  . methylphenidate 54 MG PO CR tablet    Sig: Take 1 tablet (54 mg total) by mouth every morning. Do not fill until 09/30/2017    Dispense:  30 tablet    Refill:  0   Medications Discontinued During  This Encounter  Medication Reason  . fluconazole (DIFLUCAN) 200 MG tablet   . methylphenidate 54 MG PO CR tablet Reorder  . methylphenidate 54 MG PO CR tablet Reorder  . methylphenidate 54 MG PO CR tablet Reorder   Orders Placed This Encounter  Procedures  . Flu Vaccine QUAD 36+ mos IM  . Basic metabolic panel  . CBC with Differential/Platelet  . Hepatic function panel  . Lipid panel  . HIV antibody    Signed,  Frederico Hamman T. Lory Nowaczyk, MD   Allergies as of 07/31/2017      Reactions   Bactrim [sulfamethoxazole-trimethoprim] Other (See Comments)   Joint & Muscle Pain   Clindamycin/lincomycin Other (See Comments)   C-Diff Infection      Medication List       Accurate as of 07/31/17 11:59 PM. Always use your most recent med list.          albuterol 108 (90 Base) MCG/ACT inhaler Commonly known as:  PROVENTIL HFA;VENTOLIN HFA Inhale 2 puffs into the lungs every 6 (six) hours as needed.   cetirizine 10 MG tablet Commonly known as:  ZYRTEC Take 10 mg by mouth daily.   Loratadine 10 MG Caps Take 1 capsule by mouth daily.   methylphenidate 54 MG CR tablet Commonly known as:  CONCERTA Take 1 tablet (54 mg total) by mouth every morning. Do not fill until 09/30/2017   mometasone 50 MCG/ACT nasal spray Commonly known as:  NASONEX Place 2 sprays into the nose daily.   montelukast 10 MG tablet Commonly known as:  SINGULAIR Take 10 mg by mouth at bedtime.            Discharge Care Instructions        Start     Ordered   07/31/17 0000  methylphenidate 54 MG PO CR tablet   Every morning - 10a     07/31/17 1521   07/31/17 2446  Basic metabolic panel     28/63/81 1523   07/31/17 0000  CBC with Differential/Platelet     07/31/17 1523   07/31/17 0000  Hepatic function panel     07/31/17 1523   07/31/17 0000  Lipid panel     07/31/17 1523   07/31/17 0000  HIV antibody     07/31/17 1523   07/31/17 0000  Flu Vaccine QUAD 36+ mos IM     07/31/17 1532

## 2017-08-01 LAB — HIV ANTIBODY (ROUTINE TESTING W REFLEX): HIV 1&2 Ab, 4th Generation: NONREACTIVE

## 2017-09-30 DIAGNOSIS — J019 Acute sinusitis, unspecified: Secondary | ICD-10-CM | POA: Diagnosis not present

## 2017-09-30 DIAGNOSIS — J320 Chronic maxillary sinusitis: Secondary | ICD-10-CM | POA: Diagnosis not present

## 2017-10-16 DIAGNOSIS — J320 Chronic maxillary sinusitis: Secondary | ICD-10-CM | POA: Diagnosis not present

## 2017-11-25 ENCOUNTER — Other Ambulatory Visit: Payer: Self-pay | Admitting: Family Medicine

## 2017-11-26 MED ORDER — METHYLPHENIDATE HCL ER (OSM) 54 MG PO TBCR: 54.0000 mg | EXTENDED_RELEASE_TABLET | Freq: Every morning | ORAL | 0 refills | Status: DC

## 2017-11-26 MED ORDER — METHYLPHENIDATE HCL ER (OSM) 54 MG PO TBCR
54.0000 mg | EXTENDED_RELEASE_TABLET | ORAL | 0 refills | Status: DC
Start: 2017-11-26 — End: 2018-03-02

## 2017-11-26 MED ORDER — METHYLPHENIDATE HCL ER (OSM) 54 MG PO TBCR: 54.0000 mg | EXTENDED_RELEASE_TABLET | ORAL | 0 refills | Status: DC

## 2017-11-26 NOTE — Telephone Encounter (Signed)
Ok to refill 3 month supply - I will e sign if you can get ready

## 2017-11-26 NOTE — Telephone Encounter (Signed)
Last office visit 07/31/2017.  Last refilled 07/31/2017 for 3 months.  Ok to refill?

## 2018-02-03 ENCOUNTER — Encounter: Payer: Self-pay | Admitting: Internal Medicine

## 2018-02-03 ENCOUNTER — Telehealth: Payer: Self-pay | Admitting: Internal Medicine

## 2018-02-03 NOTE — Telephone Encounter (Signed)
3 attempts to schedule fu appt from recall list.  Mailed letter. Deleting recall.  ° °

## 2018-02-04 NOTE — Telephone Encounter (Signed)
ok 

## 2018-03-02 ENCOUNTER — Other Ambulatory Visit: Payer: Self-pay | Admitting: Family Medicine

## 2018-03-02 MED ORDER — METHYLPHENIDATE HCL ER (OSM) 54 MG PO TBCR: 54.0000 mg | EXTENDED_RELEASE_TABLET | ORAL | 0 refills | Status: DC

## 2018-03-02 MED ORDER — METHYLPHENIDATE HCL ER (OSM) 54 MG PO TBCR: 54.0000 mg | EXTENDED_RELEASE_TABLET | Freq: Every morning | ORAL | 0 refills | Status: DC

## 2018-03-02 NOTE — Telephone Encounter (Signed)
Last office visit 07/31/2017. Last refilled 11/26/2017 x 3 months.  Ok to refill?

## 2018-05-11 ENCOUNTER — Telehealth: Payer: Self-pay | Admitting: *Deleted

## 2018-05-11 NOTE — Telephone Encounter (Signed)
[facility] PA for Concerta 54 mg.  PA completed on CoverMyMeds.  Sent for review.  Can take up to 72 hours for a decision.

## 2018-05-11 NOTE — Telephone Encounter (Signed)
PA approved until 05/12/2019.  [facility] notified of approval via fax.

## 2018-05-22 DIAGNOSIS — J301 Allergic rhinitis due to pollen: Secondary | ICD-10-CM | POA: Diagnosis not present

## 2018-05-22 DIAGNOSIS — J320 Chronic maxillary sinusitis: Secondary | ICD-10-CM | POA: Diagnosis not present

## 2018-06-08 DIAGNOSIS — J301 Allergic rhinitis due to pollen: Secondary | ICD-10-CM | POA: Diagnosis not present

## 2018-06-15 ENCOUNTER — Other Ambulatory Visit: Payer: Self-pay | Admitting: Family Medicine

## 2018-06-15 MED ORDER — METHYLPHENIDATE HCL ER (OSM) 54 MG PO TBCR: 54.0000 mg | EXTENDED_RELEASE_TABLET | Freq: Every morning | ORAL | 0 refills | Status: DC

## 2018-06-15 NOTE — Telephone Encounter (Signed)
Last office visit 07/31/2017.  Last refilled 03/02/2018 x 3 months.  No future appointments scheduled. UDS/Contract 03/05/2017.  Refill?

## 2018-07-22 ENCOUNTER — Other Ambulatory Visit: Payer: Self-pay | Admitting: Family Medicine

## 2018-07-22 MED ORDER — METHYLPHENIDATE HCL ER (OSM) 54 MG PO TBCR: 54.0000 mg | EXTENDED_RELEASE_TABLET | Freq: Every morning | ORAL | 0 refills | Status: DC

## 2018-07-22 NOTE — Telephone Encounter (Signed)
Name of Medication: Methylphenidate 54 mg [facility] [place] [place] Date and Quantity: # 30 on 06/15/18 Last Office Visit and Type: 07/31/17 annual Next Office Visit and Type: 08/17/18 annual Last Controlled Substance Agreement Date: 03/05/17 Last UDS: 03/05/17

## 2018-08-17 ENCOUNTER — Other Ambulatory Visit (INDEPENDENT_AMBULATORY_CARE_PROVIDER_SITE_OTHER): Payer: 59

## 2018-08-17 ENCOUNTER — Encounter: Payer: 59 | Admitting: Family Medicine

## 2018-08-17 DIAGNOSIS — Z0000 Encounter for general adult medical examination without abnormal findings: Secondary | ICD-10-CM | POA: Diagnosis not present

## 2018-08-17 LAB — CBC WITH DIFFERENTIAL/PLATELET
BASOS PCT: 1.2 % (ref 0.0–3.0)
Basophils Absolute: 0.1 10*3/uL (ref 0.0–0.1)
EOS PCT: 5.8 % — AB (ref 0.0–5.0)
Eosinophils Absolute: 0.4 10*3/uL (ref 0.0–0.7)
HCT: 41.8 % (ref 39.0–52.0)
HEMOGLOBIN: 13.9 g/dL (ref 13.0–17.0)
Lymphocytes Relative: 23.5 % (ref 12.0–46.0)
Lymphs Abs: 1.5 10*3/uL (ref 0.7–4.0)
MCHC: 33.1 g/dL (ref 30.0–36.0)
MCV: 88.3 fl (ref 78.0–100.0)
MONOS PCT: 11.2 % (ref 3.0–12.0)
Monocytes Absolute: 0.7 10*3/uL (ref 0.1–1.0)
Neutro Abs: 3.7 10*3/uL (ref 1.4–7.7)
Neutrophils Relative %: 58.3 % (ref 43.0–77.0)
Platelets: 219 10*3/uL (ref 150.0–400.0)
RBC: 4.73 Mil/uL (ref 4.22–5.81)
RDW: 13.9 % (ref 11.5–15.5)
WBC: 6.3 10*3/uL (ref 4.0–10.5)

## 2018-08-17 LAB — BASIC METABOLIC PANEL
BUN: 16 mg/dL (ref 6–23)
CHLORIDE: 103 meq/L (ref 96–112)
CO2: 32 mEq/L (ref 19–32)
Calcium: 9.2 mg/dL (ref 8.4–10.5)
Creatinine, Ser: 1.09 mg/dL (ref 0.40–1.50)
GFR: 81.29 mL/min (ref >60.00)
GLUCOSE: 88 mg/dL (ref 70–99)
POTASSIUM: 4.5 meq/L (ref 3.5–5.1)
SODIUM: 139 meq/L (ref 135–145)

## 2018-08-17 LAB — LIPID PANEL
CHOL/HDL RATIO: 4
Cholesterol: 228 mg/dL — ABNORMAL HIGH (ref 0–200)
HDL: 60.1 mg/dL (ref >39.00)
NonHDL: 168.02
Triglycerides: 211 mg/dL — ABNORMAL HIGH (ref 0.0–149.0)
VLDL: 42.2 mg/dL — AB (ref 0.0–40.0)

## 2018-08-17 LAB — HEPATIC FUNCTION PANEL
ALT: 18 U/L (ref 0–53)
AST: 20 U/L (ref 0–37)
Albumin: 4 g/dL (ref 3.5–5.2)
Alkaline Phosphatase: 42 U/L (ref 39–117)
BILIRUBIN TOTAL: 0.6 mg/dL (ref 0.2–1.2)
Bilirubin, Direct: 0.1 mg/dL (ref 0.0–0.3)
Total Protein: 6.5 g/dL (ref 6.0–8.3)

## 2018-08-17 LAB — TSH: TSH: 1.98 u[IU]/mL (ref 0.35–4.50)

## 2018-08-17 LAB — LDL CHOLESTEROL, DIRECT: Direct LDL: 147 mg/dL

## 2018-08-19 ENCOUNTER — Encounter: Payer: Self-pay | Admitting: Family Medicine

## 2018-08-19 ENCOUNTER — Ambulatory Visit (INDEPENDENT_AMBULATORY_CARE_PROVIDER_SITE_OTHER): Payer: 59 | Admitting: Family Medicine

## 2018-08-19 VITALS — BP 120/70 | HR 75 | Temp 98.5°F | Ht 72.25 in | Wt 280.5 lb

## 2018-08-19 DIAGNOSIS — Z0000 Encounter for general adult medical examination without abnormal findings: Secondary | ICD-10-CM | POA: Diagnosis not present

## 2018-08-19 DIAGNOSIS — Z23 Encounter for immunization: Secondary | ICD-10-CM | POA: Diagnosis not present

## 2018-08-19 DIAGNOSIS — F909 Attention-deficit hyperactivity disorder, unspecified type: Secondary | ICD-10-CM

## 2018-08-19 MED ORDER — METHYLPHENIDATE HCL ER (OSM) 54 MG PO TBCR: 54.0000 mg | EXTENDED_RELEASE_TABLET | ORAL | 0 refills | Status: DC

## 2018-08-19 MED ORDER — METHYLPHENIDATE HCL ER (OSM) 54 MG PO TBCR: 54.0000 mg | EXTENDED_RELEASE_TABLET | Freq: Every morning | ORAL | 0 refills | Status: DC

## 2018-08-19 NOTE — Progress Notes (Signed)
Dr. Frederico Hamman T. Cabrini Ruggieri, MD, [facility] [place], [address] Phone: [phone] Fax: [phone]  08/19/2018  Patient: Tristan Mcgee, MRN: [id number], DOB: Jul 21, 1981, 37 y.o.  Primary Physician:  Owens Loffler, MD   Chief Complaint  Patient presents with  . Annual Exam   Subjective:   Tristan Mcgee is a 37 y.o. pleasant patient who presents with the following:  Preventative Health Maintenance Visit:  Health Maintenance Summary Reviewed and updated, unless pt declines services.  Tobacco History Reviewed. Alcohol: No concerns, no excessive use Exercise Habits: Some activity, rec at least 30 mins 5 times a week STD concerns: no risk or activity to increase risk Drug Use: None Encouraged self-testicular check  Recent cruise  Wt Readings from Last 3 Encounters:  08/19/18 280 lb 8 oz (127.2 kg)  07/31/17 256 lb 8 oz (116.3 kg)  10/08/16 256 lb (116.1 kg)    Wt gain, wife with thyroid cancer  Health Maintenance  Topic Date Due  . INFLUENZA VACCINE  06/04/2018  . TETANUS/TDAP  05/16/2026  . HIV Screening  Completed   Immunization History  Administered Date(s) Administered  . Influenza,inj,Quad PF,6+ Mos 07/31/2017  . Tdap 05/16/2016   Patient Active Problem List   Diagnosis Date Noted  . Concussion without loss of consciousness 09/21/2015  . H/O bariatric surgery (SLEEVE) 09/21/2015  . Low back pain 10/20/2012  . ASTHMA NOS W/ACUTE EXACERBATION 11/01/2010  . Hypertension 10/17/2010  . Attention deficit disorder 08/23/2009   Past Medical History:  Diagnosis Date  . ADD (attention deficit disorder)   . Allergic rhinitis due to pollen   . Asthma   . Concussion without loss of consciousness 09/21/2015  . H/O bariatric surgery (SLEEVE) 09/21/2015  . Hypertension 10/17/2010   Past Surgical History:  Procedure Laterality Date  . CHOLECYSTECTOMY    . SLEEVE GASTROPLASTY  2015   Social History     Socioeconomic History  . Marital status: Married    Spouse name: Not on file  . Number of children: Not on file  . Years of education: Not on file  . Highest education level: Not on file  Occupational History  . Occupation: Financial planner  Social Needs  . Financial resource strain: Not on file  . Food insecurity:    Worry: Not on file    Inability: Not on file  . Transportation needs:    Medical: Not on file    Non-medical: Not on file  Tobacco Use  . Smoking status: Never Smoker  . Smokeless tobacco: Never Used  Substance and Sexual Activity  . Alcohol use: Yes    Alcohol/week: 0.0 standard drinks    Comment: occ  . Drug use: No  . Sexual activity: Not on file  Lifestyle  . Physical activity:    Days per week: Not on file    Minutes per session: Not on file  . Stress: Not on file  Relationships  . Social connections:    Talks on phone: Not on file    Gets together: Not on file    Attends religious service: Not on file    Active member of club or organization: Not on file    Attends meetings of clubs or organizations: Not on file    Relationship status: Not on file  . Intimate partner violence:    Fear of current or ex partner: Not on file    Emotionally abused: Not on file  Physically abused: Not on file    Forced sexual activity: Not on file  Other Topics Concern  . Not on file  Social History Narrative   Regular exercise-yes   Family History  Problem Relation Age of Onset  . Hypertension Father   . Cancer Maternal Grandmother   . Heart disease Maternal Grandfather   . Colon cancer Paternal Grandmother   . Heart disease Paternal Grandfather    Allergies  Allergen Reactions  . Bactrim [Sulfamethoxazole-Trimethoprim] Other (See Comments)    Joint & Muscle Pain  . Clindamycin/Lincomycin Other (See Comments)    C-Diff Infection    Medication list has been reviewed and updated.   General: Denies fever, chills, sweats. No significant weight  loss. Eyes: Denies blurring,significant itching ENT: Denies earache, sore throat, and hoarseness. Cardiovascular: Denies chest pains, palpitations, dyspnea on exertion Respiratory: Denies cough, dyspnea at rest,wheeezing Breast: no concerns about lumps GI: Denies nausea, vomiting, diarrhea, constipation, change in bowel habits, abdominal pain, melena, hematochezia GU: Denies penile discharge, ED, urinary flow / outflow problems. No STD concerns. Musculoskeletal: Denies back pain, joint pain Derm: Denies rash, itching Neuro: Denies  paresthesias, frequent falls, frequent headaches Psych: Denies depression, anxiety Endocrine: Denies cold intolerance, heat intolerance, polydipsia Heme: Denies enlarged lymph nodes Allergy: No hayfever  Objective:   BP 120/70   Pulse 75   Temp 98.5 F (36.9 C) (Oral)   Ht 6' 0.25" (1.835 m)   Wt 280 lb 8 oz (127.2 kg)   BMI 37.78 kg/m  Ideal Body Weight: Weight in (lb) to have BMI = 25: 185.2  No exam data present  GEN: well developed, well nourished, no acute distress Eyes: conjunctiva and lids normal, PERRLA, EOMI ENT: TM clear, nares clear, oral exam WNL Neck: supple, no lymphadenopathy, no thyromegaly, no JVD Pulm: clear to auscultation and percussion, respiratory effort normal CV: regular rate and rhythm, S1-S2, no murmur, rub or gallop, no bruits, peripheral pulses normal and symmetric, no cyanosis, clubbing, edema or varicosities GI: soft, non-tender; no hepatosplenomegaly, masses; active bowel sounds all quadrants GU: no hernia, testicular mass, penile discharge Lymph: no cervical, axillary or inguinal adenopathy MSK: gait normal, muscle tone and strength WNL, no joint swelling, effusions, discoloration, crepitus  SKIN: clear, good turgor, color WNL, no rashes, lesions, or ulcerations Neuro: normal mental status, normal strength, sensation, and motion Psych: alert; oriented to person, place and time, normally interactive and not anxious  or depressed in appearance.  All labs reviewed with patient.  Lipids:    Component Value Date/Time   CHOL 228 (H) 08/17/2018 1302   TRIG 211.0 (H) 08/17/2018 1302   HDL 60.10 08/17/2018 1302   LDLDIRECT 147.0 08/17/2018 1302   VLDL 42.2 (H) 08/17/2018 1302   CHOLHDL 4 08/17/2018 1302   CBC: CBC Latest Ref Rng & Units 08/17/2018 07/31/2017 03/04/2016  WBC 4.0 - 10.5 K/uL 6.3 12.0(H) 6.1  Hemoglobin 13.0 - 17.0 g/dL 13.9 14.5 13.2  Hematocrit 39.0 - 52.0 % 41.8 44.4 39.2  Platelets 150.0 - 400.0 K/uL 219.0 260.0 536.6    Basic Metabolic Panel:    Component Value Date/Time   NA 139 08/17/2018 1302   NA 136 05/20/2012 1951   K 4.5 08/17/2018 1302   K 4.1 05/20/2012 1951   CL 103 08/17/2018 1302   CL 100 05/20/2012 1951   CO2 32 08/17/2018 1302   CO2 25 05/20/2012 1951   BUN 16 08/17/2018 1302   BUN 12 05/20/2012 1951   CREATININE 1.09 08/17/2018 1302  CREATININE 1.11 05/20/2012 1951   GLUCOSE 88 08/17/2018 1302   GLUCOSE 88 05/20/2012 1951   CALCIUM 9.2 08/17/2018 1302   CALCIUM 9.4 05/20/2012 1951   Hepatic Function Latest Ref Rng & Units 08/17/2018 07/31/2017 03/04/2016  Total Protein 6.0 - 8.3 g/dL 6.5 7.3 7.0  Albumin 3.5 - 5.2 g/dL 4.0 4.6 4.3  AST 0 - 37 U/L 20 25 18   ALT 0 - 53 U/L 18 21 14   Alk Phosphatase 39 - 117 U/L 42 32(L) 38(L)  Total Bilirubin 0.2 - 1.2 mg/dL 0.6 0.7 1.1  Bilirubin, Direct 0.0 - 0.3 mg/dL 0.1 0.1 0.2    Lab Results  Component Value Date   TSH 1.98 08/17/2018   No results found for: PSA  Assessment and Plan:   Healthcare maintenance  Health Maintenance Exam: The patient's preventative maintenance and recommended screening tests for an annual wellness exam were reviewed in full today. Brought up to date unless services declined.  Counselled on the importance of diet, exercise, and its role in overall health and mortality. The patient's FH and SH was reviewed, including their home life, tobacco status, and drug and alcohol  status.  Follow-up in 1 year for physical exam or additional follow-up below.  Follow-up: No follow-ups on file. Or follow-up in 1 year if not noted.  Signed,  Maud Deed. Theodoros Stjames, MD   Allergies as of 08/19/2018      Reactions   Bactrim [sulfamethoxazole-trimethoprim] Other (See Comments)   Joint & Muscle Pain   Clindamycin/lincomycin Other (See Comments)   C-Diff Infection      Medication List        Accurate as of 08/19/18  9:26 AM. Always use your most recent med list.          albuterol 108 (90 Base) MCG/ACT inhaler Commonly known as:  PROVENTIL HFA;VENTOLIN HFA Inhale 2 puffs into the lungs every 6 (six) hours as needed.   cetirizine 10 MG tablet Commonly known as:  ZYRTEC Take 10 mg by mouth daily.   Loratadine 10 MG Caps Take 1 capsule by mouth daily.   methylphenidate 54 MG CR tablet Commonly known as:  CONCERTA Take 1 tablet (54 mg total) by mouth every morning.   methylphenidate 54 MG CR tablet Commonly known as:  CONCERTA Take 1 tablet (54 mg total) by mouth every morning.   methylphenidate 54 MG CR tablet Commonly known as:  CONCERTA Take 1 tablet (54 mg total) by mouth every morning.   mometasone 50 MCG/ACT nasal spray Commonly known as:  NASONEX Place 2 sprays into the nose daily.   montelukast 10 MG tablet Commonly known as:  SINGULAIR Take 10 mg by mouth at bedtime.

## 2018-08-20 LAB — PAIN MGMT, PROFILE 8 W/CONF, U
6 Acetylmorphine: NEGATIVE ng/mL (ref <10)
ALCOHOL METABOLITES: NEGATIVE ng/mL (ref <500)
AMPHETAMINES: NEGATIVE ng/mL (ref <500)
BENZODIAZEPINES: NEGATIVE ng/mL (ref <100)
BUPRENORPHINE, URINE: NEGATIVE ng/mL (ref <5)
COCAINE METABOLITE: NEGATIVE ng/mL (ref <150)
Creatinine: 106.6 mg/dL
MARIJUANA METABOLITE: NEGATIVE ng/mL (ref <20)
MDMA: NEGATIVE ng/mL (ref <500)
OXYCODONE: NEGATIVE ng/mL (ref <100)
Opiates: NEGATIVE ng/mL (ref <100)
Oxidant: NEGATIVE ug/mL (ref <200)
PH: 6.3 (ref 4.5–9.0)

## 2018-09-24 ENCOUNTER — Other Ambulatory Visit: Payer: Self-pay | Admitting: Family Medicine

## 2018-09-24 DIAGNOSIS — F909 Attention-deficit hyperactivity disorder, unspecified type: Secondary | ICD-10-CM

## 2018-11-02 DIAGNOSIS — J320 Chronic maxillary sinusitis: Secondary | ICD-10-CM | POA: Diagnosis not present

## 2018-11-27 ENCOUNTER — Other Ambulatory Visit: Payer: Self-pay | Admitting: Family Medicine

## 2018-11-27 DIAGNOSIS — F909 Attention-deficit hyperactivity disorder, unspecified type: Secondary | ICD-10-CM

## 2018-11-27 NOTE — Telephone Encounter (Signed)
Name of Medication:methylphenidate 54 mg CR  [facility] [place] or Written Date and Quantity:# 30 on 08/19/18 x2 Last Office Visit and Type: 08/19/18 annual Next Office Visit and Type: none scheduled Last Controlled Substance Agreement Date: 03/05/17 Last UDS:08/19/18

## 2018-11-27 NOTE — Telephone Encounter (Signed)
Patient is also asking [facility].

## 2018-11-28 MED ORDER — METHYLPHENIDATE HCL ER (OSM) 54 MG PO TBCR: 54.0000 mg | EXTENDED_RELEASE_TABLET | ORAL | 0 refills | Status: DC

## 2018-11-28 MED ORDER — METHYLPHENIDATE HCL ER (OSM) 54 MG PO TBCR: 54.0000 mg | EXTENDED_RELEASE_TABLET | Freq: Every morning | ORAL | 0 refills | Status: DC

## 2019-03-08 ENCOUNTER — Other Ambulatory Visit: Payer: Self-pay | Admitting: Family Medicine

## 2019-03-08 DIAGNOSIS — F909 Attention-deficit hyperactivity disorder, unspecified type: Secondary | ICD-10-CM

## 2019-03-09 MED ORDER — METHYLPHENIDATE HCL ER (OSM) 54 MG PO TBCR: 54.0000 mg | EXTENDED_RELEASE_TABLET | ORAL | 0 refills | Status: DC

## 2019-03-09 MED ORDER — METHYLPHENIDATE HCL ER (OSM) 54 MG PO TBCR: 54.0000 mg | EXTENDED_RELEASE_TABLET | Freq: Every morning | ORAL | 0 refills | Status: DC

## 2019-03-09 NOTE — Telephone Encounter (Signed)
Last office visit 08/19/2018 for CPE.  Last refilled 11/28/2018 x 3 months.  No future appointments.

## 2019-06-13 ENCOUNTER — Telehealth: Payer: Self-pay | Admitting: Family Medicine

## 2019-06-13 DIAGNOSIS — F909 Attention-deficit hyperactivity disorder, unspecified type: Secondary | ICD-10-CM

## 2019-06-14 MED ORDER — METHYLPHENIDATE HCL ER (OSM) 54 MG PO TBCR: 54.0000 mg | EXTENDED_RELEASE_TABLET | Freq: Every morning | ORAL | 0 refills | Status: DC

## 2019-06-14 MED ORDER — METHYLPHENIDATE HCL ER (OSM) 54 MG PO TBCR: 54.0000 mg | EXTENDED_RELEASE_TABLET | ORAL | 0 refills | Status: DC

## 2019-06-14 NOTE — Telephone Encounter (Signed)
Last office visit 08/19/2018 for CPE.  Last refilled 03/09/2019 x 3 months.  No future appointments.

## 2019-06-15 NOTE — Telephone Encounter (Signed)
Patient called and stated he updated his insurance information.  He now has [place]. And would like to make sure this medication is sent as the generic and a 90 day supply so insurance will cover

## 2019-06-15 NOTE — Telephone Encounter (Signed)
Noted.   I did receive PA request from [place] but they also had old insurance.  I have ask pharmacy to run medication again through correct [employer].  According to CoverMyMeds a PA is not required.

## 2019-08-02 ENCOUNTER — Telehealth: Payer: Self-pay

## 2019-08-02 NOTE — Telephone Encounter (Signed)
Pt said on 07/20/19 pt started with SOB and worsened over weekend and pt was seen at [facility] on 07/26/19 with SOB and difficulty breathing, T 99.6; covid test negative on 07/28/19. On 07/30/19 pt not feeling any better and Dr Pryor Ochoa at [facility] where pts wife works did not see pt but prescribed Advair and prednisone. Pt is still taking prednisone and advair and if immobile feels OK but upon exertion like going up stairs pt gets very SOB and feels like he cannot inhale in. Pt has hx of asthma with exacerbation. ENT advised probably needs a CXR. Dr Glori Bickers had available appt this afternoon but due to pts symptoms needs to go to [facility] with x[facility] capability. Pt is going to call [facility] to see if they do xrays and if not will go to [place].sending FYI to Dr Glori Bickers and Dr Lorelei Pont.

## 2019-08-02 NOTE — Telephone Encounter (Signed)
agree

## 2019-08-26 ENCOUNTER — Encounter: Payer: Self-pay | Admitting: Family Medicine

## 2019-09-02 ENCOUNTER — Encounter: Payer: Self-pay | Admitting: Pulmonary Disease

## 2019-09-02 ENCOUNTER — Ambulatory Visit: Payer: Managed Care, Other (non HMO) | Admitting: Pulmonary Disease

## 2019-09-02 ENCOUNTER — Other Ambulatory Visit: Payer: Self-pay | Admitting: Family Medicine

## 2019-09-02 ENCOUNTER — Other Ambulatory Visit: Payer: Self-pay

## 2019-09-02 VITALS — BP 134/80 | HR 70 | Temp 97.4°F | Ht 72.0 in | Wt 262.0 lb

## 2019-09-02 DIAGNOSIS — J45990 Exercise induced bronchospasm: Secondary | ICD-10-CM | POA: Diagnosis not present

## 2019-09-02 DIAGNOSIS — R0602 Shortness of breath: Secondary | ICD-10-CM | POA: Diagnosis not present

## 2019-09-02 DIAGNOSIS — Z131 Encounter for screening for diabetes mellitus: Secondary | ICD-10-CM

## 2019-09-02 DIAGNOSIS — Z0000 Encounter for general adult medical examination without abnormal findings: Secondary | ICD-10-CM

## 2019-09-02 DIAGNOSIS — R0689 Other abnormalities of breathing: Secondary | ICD-10-CM | POA: Diagnosis not present

## 2019-09-02 NOTE — Progress Notes (Signed)
Subjective:    Patient ID: Tristan Mcgee, male    DOB: 1981-06-25, 38 y.o.   MRN: [id number]  HPI Tristan Mcgee is a 38 year old lifelong never smoker who presents for the issue of having inability to take a full breath.  This issue is intermittent and does not occur daily.  He states that he feels like he cannot "fill" his lungs.  He states that that has been going on for 6 weeks.  He is referred.  He states that stressors, and exercise make the sensation worse.  He carries a diagnosis of asthma.  He was told in the past "years ago" that he had exercise-induced asthma.  He also notes seasonal allergies.  He never uses an inhaler and has not tried his albuterol inhaler to see if it will help the sensation he is having.  He states he uses the albuterol inhaler only for 2 to 3 days if he is sick.  He does not note any cough, sputum production or hemoptysis.  No chest pain or chest tightness.  No lower extremity edema.  He notes that he got a Peloton bike in July and since then has been having issues with this symptom.  During the course of the HPI he does continue "sighing".  He has had no fevers, chills or sweats.  No unintentional weight loss and no anorexia.  Patient has a history of ADHD and is on Concerta.  He has been on this medication for 4 to 5 years.  He has allergies to sulfa and clindamycin.   Past medical history, surgical history and family history have been reviewed.  He does have dogs in the home, no cats.  No exotic pets.  He does not have occupational exposures.  Works mostly out of the home since COVID-19 pandemic started.   Review of Systems  Constitutional: Negative.   HENT: Positive for postnasal drip.   Eyes: Negative.   Respiratory: Positive for shortness of breath.   Cardiovascular: Negative.   Gastrointestinal: Negative.        No reflux symptoms  Endocrine: Negative.   Genitourinary: Negative.   Musculoskeletal: Negative.   Skin: Negative.   Allergic/Immunologic:  Negative.   Neurological: Negative.   Hematological: Negative.   Psychiatric/Behavioral: The patient is nervous/anxious.   All other systems reviewed and are negative.      Objective:   Physical Exam Vitals signs and nursing note reviewed.  Constitutional:      General: He is not in acute distress.    Appearance: Normal appearance. He is obese.     Comments: Occasionally is noted to have sighing respirations.  HENT:     Head: Normocephalic and atraumatic.     Right Ear: External ear normal.     Left Ear: External ear normal.     Nose:     Comments: Nose/mouth/throat not examined due to masking requirements for COVID 19. Eyes:     General: Scleral icterus present.     Conjunctiva/sclera: Conjunctivae normal.     Pupils: Pupils are equal, round, and reactive to light.  Neck:     Musculoskeletal: Neck supple.     Trachea: Trachea and phonation normal.  Cardiovascular:     Rate and Rhythm: Normal rate and regular rhythm.     Pulses: Normal pulses.     Heart sounds: Normal heart sounds, S1 normal and S2 normal. No murmur.  Pulmonary:     Effort: Pulmonary effort is normal.     Breath sounds:  Normal breath sounds and air entry. No wheezing or rhonchi.     Comments: Occasional "sighing" respirations Abdominal:     General: There is no distension.     Palpations: Abdomen is soft.  Musculoskeletal: Normal range of motion.     Right lower leg: No edema.     Left lower leg: No edema.  Skin:    General: Skin is warm and dry.  Neurological:     General: No focal deficit present.     Mental Status: He is alert and oriented to person, place, and time.  Psychiatric:        Mood and Affect: Mood normal.        Behavior: Behavior normal.        Assessment & Plan:   1.  Shortness of breath: We will obtain PFTs to delve into this symptom.  He has noted mostly since he started more intense exercise.  Query recurrence of his issues with exercise-induced asthma as below.  2.   Sighing respirations: These are usually related to stress.  He does admit to having increased stressors due to his employment situation during COVID-19 recommended meditative practices, yoga breathing and adequate stretching before and after exercise.  3.  History of exercise-induced asthma: Query if this is being triggered by his more intense exercise regimen.  I have recommended that he use albuterol before his Peloton exercise.  He may also use albuterol as needed during his episodes of shortness of breath.   We will see the patient in follow-up after PFTs are done.   Thank you for allowing me to participate in this patient's care.    This chart was dictated using voice recognition software/Dragon.  Despite best efforts to proofread, errors can occur which can change the meaning.  Any change was purely unintentional.    C. Derrill Kay, MD [facility]

## 2019-09-02 NOTE — Patient Instructions (Signed)
1.  We will arrange for breathing test.  2.  We will see you after the breathing tests are done.  3.  Try using your albuterol before your Peloton exercise.  You may also use albuterol as needed for shortness of breath up to 4 times a day.  4.  I also recommend doing yoga breathing exercises.  Another useful exercise is using a harmonica to breathe in and out to train your breathing muscles.  5.  Call us should your symptoms worsen prior to follow-up.

## 2019-09-06 ENCOUNTER — Other Ambulatory Visit: Payer: Self-pay | Admitting: Family Medicine

## 2019-09-06 DIAGNOSIS — F909 Attention-deficit hyperactivity disorder, unspecified type: Secondary | ICD-10-CM

## 2019-09-06 NOTE — Addendum Note (Signed)
Addended by: Carter Kitten on: 09/06/2019 05:01 PM   Modules accepted: Orders

## 2019-09-06 NOTE — Progress Notes (Signed)
UDS

## 2019-09-07 ENCOUNTER — Other Ambulatory Visit: Payer: Self-pay

## 2019-09-07 ENCOUNTER — Other Ambulatory Visit (INDEPENDENT_AMBULATORY_CARE_PROVIDER_SITE_OTHER): Payer: Managed Care, Other (non HMO)

## 2019-09-07 DIAGNOSIS — F909 Attention-deficit hyperactivity disorder, unspecified type: Secondary | ICD-10-CM | POA: Diagnosis not present

## 2019-09-07 DIAGNOSIS — Z131 Encounter for screening for diabetes mellitus: Secondary | ICD-10-CM

## 2019-09-07 DIAGNOSIS — Z0000 Encounter for general adult medical examination without abnormal findings: Secondary | ICD-10-CM | POA: Diagnosis not present

## 2019-09-07 LAB — BASIC METABOLIC PANEL
BUN: 16 mg/dL (ref 6–23)
CO2: 30 mEq/L (ref 19–32)
Calcium: 9.2 mg/dL (ref 8.4–10.5)
Chloride: 104 mEq/L (ref 96–112)
Creatinine, Ser: 1.05 mg/dL (ref 0.40–1.50)
GFR: 79.39 mL/min (ref >60.00)
Glucose, Bld: 93 mg/dL (ref 70–99)
Potassium: 4.4 mEq/L (ref 3.5–5.1)
Sodium: 139 mEq/L (ref 135–145)

## 2019-09-07 LAB — CBC WITH DIFFERENTIAL/PLATELET
Basophils Absolute: 0.1 10*3/uL (ref 0.0–0.1)
Basophils Relative: 1.3 % (ref 0.0–3.0)
Eosinophils Absolute: 0.3 10*3/uL (ref 0.0–0.7)
Eosinophils Relative: 4.7 % (ref 0.0–5.0)
HCT: 41.6 % (ref 39.0–52.0)
Hemoglobin: 13.8 g/dL (ref 13.0–17.0)
Lymphocytes Relative: 28.5 % (ref 12.0–46.0)
Lymphs Abs: 1.8 10*3/uL (ref 0.7–4.0)
MCHC: 33.2 g/dL (ref 30.0–36.0)
MCV: 88.8 fl (ref 78.0–100.0)
Monocytes Absolute: 0.8 10*3/uL (ref 0.1–1.0)
Monocytes Relative: 12.5 % — ABNORMAL HIGH (ref 3.0–12.0)
Neutro Abs: 3.3 10*3/uL (ref 1.4–7.7)
Neutrophils Relative %: 53 % (ref 43.0–77.0)
Platelets: 258 10*3/uL (ref 150.0–400.0)
RBC: 4.69 Mil/uL (ref 4.22–5.81)
RDW: 13.7 % (ref 11.5–15.5)
WBC: 6.2 10*3/uL (ref 4.0–10.5)

## 2019-09-07 LAB — HEPATIC FUNCTION PANEL
ALT: 16 U/L (ref 0–53)
AST: 18 U/L (ref 0–37)
Albumin: 4.3 g/dL (ref 3.5–5.2)
Alkaline Phosphatase: 37 U/L — ABNORMAL LOW (ref 39–117)
Bilirubin, Direct: 0.1 mg/dL (ref 0.0–0.3)
Total Bilirubin: 0.6 mg/dL (ref 0.2–1.2)
Total Protein: 6.6 g/dL (ref 6.0–8.3)

## 2019-09-07 LAB — LIPID PANEL
Cholesterol: 198 mg/dL (ref 0–200)
HDL: 58.4 mg/dL (ref >39.00)
LDL Cholesterol: 120 mg/dL — ABNORMAL HIGH (ref 0–99)
NonHDL: 139.2
Total CHOL/HDL Ratio: 3
Triglycerides: 95 mg/dL (ref 0.0–149.0)
VLDL: 19 mg/dL (ref 0.0–40.0)

## 2019-09-08 LAB — PAIN MGMT, PROFILE 8 W/CONF, U
6 Acetylmorphine: NEGATIVE ng/mL
Alcohol Metabolites: NEGATIVE ng/mL (ref <500)
Amphetamines: NEGATIVE ng/mL
Benzodiazepines: NEGATIVE ng/mL
Buprenorphine, Urine: NEGATIVE ng/mL
Cocaine Metabolite: NEGATIVE ng/mL
Creatinine: 192.6 mg/dL
MDMA: NEGATIVE ng/mL
Marijuana Metabolite: NEGATIVE ng/mL
Opiates: NEGATIVE ng/mL
Oxidant: NEGATIVE ug/mL
Oxycodone: NEGATIVE ng/mL
pH: 6.6 (ref 4.5–9.0)

## 2019-09-08 LAB — HEMOGLOBIN A1C: Hgb A1c MFr Bld: 4.9 % (ref 4.6–6.5)

## 2019-09-12 NOTE — Progress Notes (Signed)
Coston Mandato T. Liviah Cake, MD Primary Care and Sports Medicine [facility] at [facility] [address] [place], [id number] Phone: [phone]  FAX: [phone]  Tristan Mcgee - 38 y.o. male  MRN [id number]  Date of Birth: Oct 17, 1981  Visit Date: 09/13/2019  PCP: Hannah Beatopland, Laikynn Pollio, MD  Referred by: Hannah Beatopland, Fruma Africa, MD  Chief Complaint  Patient presents with  . Annual Exam   Patient Care Team: Hannah Beatopland, Malea Swilling, MD as PCP - General Subjective:   Tristan Mcgee is a 38 y.o. pleasant patient who presents with the following:  He is a nice gentleman who I remember quite well.  Preventative Health Maintenance Visit:  Health Maintenance Summary Reviewed and updated, unless pt declines services.  Tobacco History Reviewed. Alcohol: No concerns, no excessive use Exercise Habits: Some activity, rec at least 30 mins 5 times a week. Wts and peloton STD concerns: no risk or activity to increase risk Drug Use: None Encouraged self-testicular check  Pulm and will do PFT's - ? Side breathing. ? stress Aug was worst.   ADHD is well-controlled on current meds.   Health Maintenance  Topic Date Due  . TETANUS/TDAP  05/16/2026  . INFLUENZA VACCINE  Completed  . HIV Screening  Completed   Immunization History  Administered Date(s) Administered  . Influenza,inj,Quad PF,6+ Mos 07/31/2017, 08/19/2018  . Influenza-Unspecified 08/26/2019  . Tdap 05/16/2016   Patient Active Problem List   Diagnosis Date Noted  . Concussion without loss of consciousness 09/21/2015  . H/O bariatric surgery (SLEEVE) 09/21/2015  . Low back pain 10/20/2012  . ASTHMA NOS W/ACUTE EXACERBATION 11/01/2010  . Hypertension 10/17/2010  . Attention deficit disorder 08/23/2009   Past Medical History:  Diagnosis Date  . ADD (attention deficit disorder)   . Allergic rhinitis due to pollen   . Asthma   . Concussion without loss of consciousness 09/21/2015  . H/O bariatric surgery (SLEEVE)  09/21/2015  . Hypertension 10/17/2010   Past Surgical History:  Procedure Laterality Date  . CHOLECYSTECTOMY    . SLEEVE GASTROPLASTY  2015   Social History   Socioeconomic History  . Marital status: Married    Spouse name: Not on file  . Number of children: Not on file  . Years of education: Not on file  . Highest education level: Not on file  Occupational History  . Occupation: Sport and exercise psychologistsoftware engineer  Social Needs  . Financial resource strain: Not on file  . Food insecurity    Worry: Not on file    Inability: Not on file  . Transportation needs    Medical: Not on file    Non-medical: Not on file  Tobacco Use  . Smoking status: Never Smoker  . Smokeless tobacco: Never Used  Substance and Sexual Activity  . Alcohol use: Yes    Alcohol/week: 0.0 standard drinks    Comment: occ  . Drug use: No  . Sexual activity: Not on file  Lifestyle  . Physical activity    Days per week: Not on file    Minutes per session: Not on file  . Stress: Not on file  Relationships  . Social Musicianconnections    Talks on phone: Not on file    Gets together: Not on file    Attends religious service: Not on file    Active member of club or organization: Not on file    Attends meetings of clubs or organizations: Not on file    Relationship status: Not on file  .  Intimate partner violence    Fear of current or ex partner: Not on file    Emotionally abused: Not on file    Physically abused: Not on file    Forced sexual activity: Not on file  Other Topics Concern  . Not on file  Social History Narrative   Regular exercise-yes   Family History  Problem Relation Age of Onset  . Hypertension Father   . Cancer Maternal Grandmother   . Heart disease Maternal Grandfather   . Colon cancer Paternal Grandmother   . Heart disease Paternal Grandfather    Allergies  Allergen Reactions  . Bactrim [Sulfamethoxazole-Trimethoprim] Other (See Comments)    Joint & Muscle Pain  . Clindamycin/Lincomycin Other  (See Comments)    C-Diff Infection    Medication list has been reviewed and updated.   General: Denies fever, chills, sweats. No significant weight loss. Eyes: Denies blurring,significant itching ENT: Denies earache, sore throat, and hoarseness. Cardiovascular: Denies chest pains, palpitations, dyspnea on exertion Respiratory: Denies cough, dyspnea at rest,wheeezing Breast: no concerns about lumps GI: Denies nausea, vomiting, diarrhea, constipation, change in bowel habits, abdominal pain, melena, hematochezia GU: Denies penile discharge, ED, urinary flow / outflow problems. No STD concerns. Musculoskeletal: Denies back pain, joint pain Derm: Denies rash, itching Neuro: Denies  paresthesias, frequent falls, frequent headaches Psych: Denies depression, anxiety Endocrine: Denies cold intolerance, heat intolerance, polydipsia Heme: Denies enlarged lymph nodes Allergy: No hayfever  Objective:   BP 130/78   Pulse 90   Temp 98.2 F (36.8 C) (Temporal)   Ht 6' (1.829 m)   Wt 263 lb (119.3 kg)   SpO2 98%   BMI 35.67 kg/m  Ideal Body Weight: Weight in (lb) to have BMI = 25: 183.9  Ideal Body Weight: Weight in (lb) to have BMI = 25: 183.9 No exam data present Depression screen [facility] 2/9 09/13/2019 08/19/2018 07/31/2017  Decreased Interest 0 0 0  Down, Depressed, Hopeless 0 0 0  PHQ - 2 Score 0 0 0     GEN: well developed, well nourished, no acute distress Eyes: conjunctiva and lids normal, PERRLA, EOMI ENT: TM clear, nares clear, oral exam WNL Neck: supple, no lymphadenopathy, no thyromegaly, no JVD Pulm: clear to auscultation and percussion, respiratory effort normal CV: regular rate and rhythm, S1-S2, no murmur, rub or gallop, no bruits, peripheral pulses normal and symmetric, no cyanosis, clubbing, edema or varicosities GI: soft, non-tender; no hepatosplenomegaly, masses; active bowel sounds all quadrants GU: no hernia, testicular mass, penile discharge Lymph: no cervical,  axillary or inguinal adenopathy MSK: gait normal, muscle tone and strength WNL, no joint swelling, effusions, discoloration, crepitus  SKIN: clear, good turgor, color WNL, no rashes, lesions, or ulcerations Neuro: normal mental status, normal strength, sensation, and motion Psych: alert; oriented to person, place and time, normally interactive and not anxious or depressed in appearance.  All labs reviewed with patient. Results for orders placed or performed in visit on 09/07/19  Pain Mgmt, Profile 8 w/Conf, U  Result Value Ref Range   Prescribed Drug 1 Methylphenidate    Creatinine 192.6 mg/dL   pH 6.6 4.5 - 9.0   Oxidant NEGATIVE mcg/mL   Amphetamines NEGATIVE ng/mL   medMATCH Amphetamines CONSISTENT    Benzodiazepines NEGATIVE ng/mL   medMATCH Benzodiazepines CONSISTENT    Marijuana Metabolite NEGATIVE ng/mL   medMATCH Marijuana Metab CONSISTENT    Cocaine Metabolite NEGATIVE ng/mL   medMATCH Cocaine Metab CONSISTENT    Opiates NEGATIVE ng/mL   medMATCH Opiates CONSISTENT  Oxycodone NEGATIVE ng/mL   medMATCH Oxycodone CONSISTENT    Prescribed Drug 1 Methylphenidate    Buprenorphine, Urine NEGATIVE ng/mL   medMATCH Buprenorphine CONSISTENT    Prescribed Drug 1 Methylphenidate    MDMA NEGATIVE ng/mL   [facility] MDMA CONSISTENT    Prescribed Drug 1 Methylphenidate    Alcohol Metabolites NEGATIVE <500 ng/mL   [facility] Alcohol Metab CONSISTENT    Prescribed Drug 1 Methylphenidate    6 Acetylmorphine NEGATIVE ng/mL   medMATCH 6 Acetylmorphine CONSISTENT   Hemoglobin A1c  Result Value Ref Range   Hgb A1c MFr Bld 4.9 4.6 - 6.5 %  Basic metabolic panel  Result Value Ref Range   Sodium 139 135 - 145 mEq/L   Potassium 4.4 3.5 - 5.1 mEq/L   Chloride 104 96 - 112 mEq/L   CO2 30 19 - 32 mEq/L   Glucose, Bld 93 70 - 99 mg/dL   BUN 16 6 - 23 mg/dL   Creatinine, Ser 1.05 0.40 - 1.50 mg/dL   Calcium 9.2 8.4 - 10.5 mg/dL   GFR 79.39 >60.00 mL/min  Hepatic function panel  Result  Value Ref Range   Total Bilirubin 0.6 0.2 - 1.2 mg/dL   Bilirubin, Direct 0.1 0.0 - 0.3 mg/dL   Alkaline Phosphatase 37 (L) 39 - 117 U/L   AST 18 0 - 37 U/L   ALT 16 0 - 53 U/L   Total Protein 6.6 6.0 - 8.3 g/dL   Albumin 4.3 3.5 - 5.2 g/dL  CBC with Differential/Platelet  Result Value Ref Range   WBC 6.2 4.0 - 10.5 K/uL   RBC 4.69 4.22 - 5.81 Mil/uL   Hemoglobin 13.8 13.0 - 17.0 g/dL   HCT 41.6 39.0 - 52.0 %   MCV 88.8 78.0 - 100.0 fl   MCHC 33.2 30.0 - 36.0 g/dL   RDW 13.7 11.5 - 15.5 %   Platelets 258.0 150.0 - 400.0 K/uL   Neutrophils Relative % 53.0 43.0 - 77.0 %   Lymphocytes Relative 28.5 12.0 - 46.0 %   Monocytes Relative 12.5 (H) 3.0 - 12.0 %   Eosinophils Relative 4.7 0.0 - 5.0 %   Basophils Relative 1.3 0.0 - 3.0 %   Neutro Abs 3.3 1.4 - 7.7 K/uL   Lymphs Abs 1.8 0.7 - 4.0 K/uL   Monocytes Absolute 0.8 0.1 - 1.0 K/uL   Eosinophils Absolute 0.3 0.0 - 0.7 K/uL   Basophils Absolute 0.1 0.0 - 0.1 K/uL  Lipid panel  Result Value Ref Range   Cholesterol 198 0 - 200 mg/dL   Triglycerides 95.0 0.0 - 149.0 mg/dL   HDL 58.40 >39.00 mg/dL   VLDL 19.0 0.0 - 40.0 mg/dL   LDL Cholesterol 120 (H) 0 - 99 mg/dL   Total CHOL/HDL Ratio 3    NonHDL 139.20     Assessment and Plan:     ICD-10-CM   1. Healthcare maintenance  Z00.00     Health Maintenance Exam: The patient's preventative maintenance and recommended screening tests for an annual wellness exam were reviewed in full today. Brought up to date unless services declined.  Counselled on the importance of diet, exercise, and its role in overall health and mortality. The patient's FH and SH was reviewed, including their home life, tobacco status, and drug and alcohol status.  Follow-up in 1 year for physical exam or additional follow-up below.  Follow-up: No follow-ups on file. Or follow-up in 1 year if not noted.  No orders of the defined types  were placed in this encounter.  There are no discontinued medications.  No orders of the defined types were placed in this encounter.   Signed,  Elpidio Galea. Karinna Beadles, MD   Allergies as of 09/13/2019      Reactions   Bactrim [sulfamethoxazole-trimethoprim] Other (See Comments)   Joint & Muscle Pain   Clindamycin/lincomycin Other (See Comments)   C-Diff Infection      Medication List       Accurate as of September 13, 2019  9:28 AM. If you have any questions, ask your nurse or doctor.        albuterol 108 (90 Base) MCG/ACT inhaler Commonly known as: VENTOLIN HFA Inhale 2 puffs into the lungs every 6 (six) hours as needed.   cetirizine 10 MG tablet Commonly known as: ZYRTEC Take 10 mg by mouth daily.   Loratadine 10 MG Caps Take 1 capsule by mouth daily.   methylphenidate 54 MG CR tablet Commonly known as: CONCERTA Take 1 tablet (54 mg total) by mouth every morning.   mometasone 50 MCG/ACT nasal spray Commonly known as: NASONEX Place 2 sprays into the nose daily.   montelukast 10 MG tablet Commonly known as: SINGULAIR Take 10 mg by mouth at bedtime.      ma

## 2019-09-13 ENCOUNTER — Encounter: Payer: Self-pay | Admitting: Family Medicine

## 2019-09-13 ENCOUNTER — Other Ambulatory Visit: Payer: Self-pay

## 2019-09-13 ENCOUNTER — Ambulatory Visit (INDEPENDENT_AMBULATORY_CARE_PROVIDER_SITE_OTHER): Payer: Managed Care, Other (non HMO) | Admitting: Family Medicine

## 2019-09-13 VITALS — BP 130/78 | HR 90 | Temp 98.2°F | Ht 72.0 in | Wt 263.0 lb

## 2019-09-13 DIAGNOSIS — Z0000 Encounter for general adult medical examination without abnormal findings: Secondary | ICD-10-CM

## 2019-09-13 DIAGNOSIS — F909 Attention-deficit hyperactivity disorder, unspecified type: Secondary | ICD-10-CM

## 2019-09-13 MED ORDER — METHYLPHENIDATE HCL ER (OSM) 54 MG PO TBCR: 54.0000 mg | EXTENDED_RELEASE_TABLET | ORAL | 0 refills | Status: DC

## 2019-10-11 ENCOUNTER — Telehealth: Payer: Self-pay | Admitting: Pulmonary Disease

## 2019-10-11 NOTE — Telephone Encounter (Signed)
Patient states would like to cancel PFT appt.  Doesn't want to reschedule at this time.  Patient phone number is [phone].

## 2019-10-11 NOTE — Telephone Encounter (Signed)
Lm to relay date/time of covid test. 10/13/2019 prior to 11:[facility]

## 2019-10-11 NOTE — Telephone Encounter (Signed)
Will route to [facility] to cancel.

## 2019-10-11 NOTE — Telephone Encounter (Signed)
Called and spoke with patient and advised patient that PFT has been canceled and per his request PFT has not been R/S at this time. Nothing else needed. [name]

## 2019-10-13 ENCOUNTER — Other Ambulatory Visit: Payer: Managed Care, Other (non HMO)

## 2019-10-14 ENCOUNTER — Ambulatory Visit: Payer: Managed Care, Other (non HMO)

## 2019-10-19 ENCOUNTER — Ambulatory Visit: Payer: Managed Care, Other (non HMO) | Admitting: Pulmonary Disease

## 2019-11-30 NOTE — Progress Notes (Signed)
Kallin Henk T. Sarrah Fiorenza, MD Primary Care and Sports Medicine [facility] at [facility] [address] [place], [address] Phone: [phone]  FAX: [phone]  XERXES AGRUSA - 39 y.o. male  MRN [id number]  Date of Birth: 09/02/1981  Visit Date: 12/02/2019  PCP: Hannah Beat, MD  Referred by: Hannah Beat, MD  Chief Complaint  Patient presents with  . Anxiety    This visit occurred during the SARS-CoV-2 public health emergency.  Safety protocols were in place, including screening questions prior to the visit, additional usage of staff PPE, and extensive cleaning of exam room while observing appropriate contact time as indicated for disinfecting solutions.   Subjective:   VLAD MAYBERRY is a 39 y.o. very pleasant male patient who presents with the following:  Reyansh is here today to discuss some medication with me.  I had gone to the pulmonologist. Had a large cost. A week after christmas.  Has a pain in the top  Part of his back.  Has a peloton in august.  Will wheeze a lot at baseline.   Will get really frustated.  On and off.  Will get really frustrated.  Bad mood for the rest of the day.  Some panic attacks.  Almost all of it is work related.  Over time that has built up and will be better to disengage.  Cannot deal with it all that well.   Rash - back  ADD - switch to straterra  Past Medical History, Surgical History, Social History, Family History, Problem List, Medications, and Allergies have been reviewed and updated if relevant.   GEN: No acute illnesses, no fevers, chills. GI: No n/v/d, eating normally Pulm: No SOB Interactive and getting along well at home.  Otherwise, ROS is as per the HPI.  Objective:   BP 120/84   Pulse 78   Temp 98.5 F (36.9 C) (Temporal)   Ht 6' (1.829 m)   Wt 268 lb (121.6 kg)   SpO2 99%   BMI 36.35 kg/m    GEN: WDWN, NAD, Non-toxic, Alert & Oriented x 3 HEENT: Atraumatic, Normocephalic.  Ears  and Nose: No external deformity. EXTR: No clubbing/cyanosis/edema NEURO: Normal gait.  PSYCH: Normally interactive. Conversant. Not depressed or anxious appearing.  Calm demeanor.   He does have a lightly scaled macular rash on his torso in several places  Laboratory and Imaging Data:  Assessment and Plan:     ICD-10-CM   1. Attention deficit hyperactivity disorder (ADHD), unspecified ADHD type  F90.9   2. Generalized anxiety disorder  F41.1   3. Rash  R21    Level of Medical Decision-Making in this case is Moderate.   We are going to try him off of stimulants.  This certainly could be a reason for his increased anxiety.  If this does not really help at all with anxiety, and consideration of other treatment would be reasonable and appropriate.  This appears to be an allergic dermatitis number to place him on some topical corticosteroid  Follow-up: Return in about 1 month (around 01/02/2020).  Meds ordered this encounter  Medications  . atomoxetine (STRATTERA) 40 MG capsule    Sig: Take 1 capsule (40 mg total) by mouth 2 (two) times daily with a meal.    Dispense:  60 capsule    Refill:  2  . triamcinolone cream (KENALOG) 0.1 %    Sig: Apply 1 application topically 2 (two) times daily.    Dispense:  30 g    Refill:  1   Modified Medications   No medications on file   No orders of the defined types were placed in this encounter.   Signed,  Maud Deed. Marimar Suber, MD   Outpatient Encounter Medications as of 12/02/2019  Medication Sig  . albuterol (PROVENTIL HFA;VENTOLIN HFA) 108 (90 BASE) MCG/ACT inhaler Inhale 2 puffs into the lungs every 6 (six) hours as needed.  Marland Kitchen azelastine (ASTELIN) 0.1 % nasal spray SMARTSIG:2 Spray(s) Both Nares Every 12 Hours PRN  . cetirizine (ZYRTEC) 10 MG tablet Take 10 mg by mouth daily.  . fluticasone (FLONASE) 50 MCG/ACT nasal spray SMARTSIG:2 Puff(s) Both Nares Daily  . Loratadine 10 MG CAPS Take 1 capsule by mouth daily.  . montelukast  (SINGULAIR) 10 MG tablet Take 10 mg by mouth at bedtime.  . [DISCONTINUED] methylphenidate 54 MG PO CR tablet Take 1 tablet (54 mg total) by mouth every morning.  . [DISCONTINUED] methylphenidate 54 MG PO CR tablet Take 1 tablet (54 mg total) by mouth every morning.  . [DISCONTINUED] methylphenidate 54 MG PO CR tablet Take 1 tablet (54 mg total) by mouth every morning.  Marland Kitchen atomoxetine (STRATTERA) 40 MG capsule Take 1 capsule (40 mg total) by mouth 2 (two) times daily with a meal.  . triamcinolone cream (KENALOG) 0.1 % Apply 1 application topically 2 (two) times daily.  . [DISCONTINUED] mometasone (NASONEX) 50 MCG/ACT nasal spray Place 2 sprays into the nose daily.   No facility-administered encounter medications on file as of 12/02/2019.

## 2019-12-02 ENCOUNTER — Ambulatory Visit: Payer: Managed Care, Other (non HMO) | Admitting: Family Medicine

## 2019-12-02 ENCOUNTER — Other Ambulatory Visit: Payer: Self-pay

## 2019-12-02 ENCOUNTER — Encounter: Payer: Self-pay | Admitting: Family Medicine

## 2019-12-02 VITALS — BP 120/84 | HR 78 | Temp 98.5°F | Ht 72.0 in | Wt 268.0 lb

## 2019-12-02 DIAGNOSIS — F909 Attention-deficit hyperactivity disorder, unspecified type: Secondary | ICD-10-CM | POA: Diagnosis not present

## 2019-12-02 DIAGNOSIS — F411 Generalized anxiety disorder: Secondary | ICD-10-CM

## 2019-12-02 DIAGNOSIS — R21 Rash and other nonspecific skin eruption: Secondary | ICD-10-CM | POA: Diagnosis not present

## 2019-12-02 MED ORDER — ATOMOXETINE HCL 40 MG PO CAPS: 40.0000 mg | ORAL_CAPSULE | Freq: Two times a day (BID) | ORAL | 2 refills | Status: DC

## 2019-12-02 MED ORDER — TRIAMCINOLONE ACETONIDE 0.1 % EX CREA: 1.0000 "application " | TOPICAL_CREAM | Freq: Two times a day (BID) | CUTANEOUS | 1 refills | Status: DC

## 2019-12-02 NOTE — Patient Instructions (Signed)
Stop your methylphenidate  Can start the new medicine tomorrow, and for 1 week only take 1 tablet a day in the morning, and then increase to 1 in the morning and 1 at night.

## 2019-12-30 ENCOUNTER — Other Ambulatory Visit: Payer: Self-pay

## 2019-12-30 ENCOUNTER — Ambulatory Visit: Payer: Commercial Managed Care - PPO | Admitting: Family Medicine

## 2019-12-30 ENCOUNTER — Encounter: Payer: Self-pay | Admitting: Family Medicine

## 2019-12-30 VITALS — BP 130/82 | HR 73 | Temp 98.3°F | Ht 72.0 in | Wt 268.5 lb

## 2019-12-30 DIAGNOSIS — F909 Attention-deficit hyperactivity disorder, unspecified type: Secondary | ICD-10-CM

## 2019-12-30 DIAGNOSIS — J4520 Mild intermittent asthma, uncomplicated: Secondary | ICD-10-CM

## 2019-12-30 MED ORDER — FLUTICASONE PROPIONATE HFA 110 MCG/ACT IN AERO: 1.0000 | INHALATION_SPRAY | Freq: Two times a day (BID) | RESPIRATORY_TRACT | 3 refills | Status: DC

## 2019-12-30 NOTE — Progress Notes (Signed)
Mcclellan Demarais T. Sukhraj Esquivias, MD Primary Care and Sports Medicine [facility] at [facility] [address] [place], [address] Phone: [phone]  FAX: [phone]  FURQAN GOSSELIN - 39 y.o. male  MRN [id number]  Date of Birth: 18-Jul-1981  Visit Date: 12/30/2019  PCP: Hannah Beat, MD  Referred by: Hannah Beat, MD  Chief Complaint  Patient presents with  . ADHD  . Asthma    This visit occurred during the SARS-CoV-2 public health emergency.  Safety protocols were in place, including screening questions prior to the visit, additional usage of staff PPE, and extensive cleaning of exam room while observing appropriate contact time as indicated for disinfecting solutions.   Subjective:   Tristan Mcgee is a 39 y.o. very pleasant male patient who presents with the following:  F/u ADHD and asthma.  D/c Ritalin and started Strattera at 40 mg BID.  Doing ok with this and doing well.  Feels like He is having a good response to the Strattera.  In many ways he feels cleaner than when he was taking Ritalin.  He is not having any kind of significant trouble sleeping at nighttime  Asthma?  Has a pain in his back.  If he tries to do cardio. 2 puffs of albuterol. Is having some SOB with minimal exertion and with walking.  He has tried some albuterol prior to exercise, and this does help somewhat, but not enough.  He does have some shortness of breath and some occasional wheezing even with going up and down some stairs and doing some basic walking.    Review of Systems is noted in the HPI, as appropriate  Objective:   BP 130/82   Pulse 73   Temp 98.3 F (36.8 C) (Temporal)   Ht 6' (1.829 m)   Wt 268 lb 8 oz (121.8 kg)   SpO2 99%   BMI 36.42 kg/m   GEN: WDWN, NAD, Non-toxic HEENT: Atraumatic, Normocephalic. Neck supple. No masses. CV: RRR, No M/G/R. No JVD. No thrill. No extra heart sounds. PULM: CTA B, no wheezes, crackles, rhonchi. No retractions. No resp.  distress. No accessory muscle use. EXTR: No c/c/e NEURO Normal gait.  PSYCH: Normally interactive. Conversant.   Laboratory and Imaging Data:  Assessment and Plan:     ICD-10-CM   1. Asthma  J45.20   2. Attention deficit hyperactivity disorder (ADHD), unspecified ADHD type  F90.9    Continue with Strattera, tolerating this well.  Asthma, increased needs.  Add Flovent scheduled.  Continue with Singulair and albuterol as needed  Follow-up: No follow-ups on file.  Meds ordered this encounter  Medications  . fluticasone (FLOVENT HFA) 110 MCG/ACT inhaler    Sig: Inhale 1 puff into the lungs 2 (two) times daily.    Dispense:  3 Inhaler    Refill:  3   There are no discontinued medications. No orders of the defined types were placed in this encounter.   Signed,  Elpidio Galea. Maliaka Brasington, MD   Outpatient Encounter Medications as of 12/30/2019  Medication Sig  . albuterol (PROVENTIL HFA;VENTOLIN HFA) 108 (90 BASE) MCG/ACT inhaler Inhale 2 puffs into the lungs every 6 (six) hours as needed.  Marland Kitchen atomoxetine (STRATTERA) 40 MG capsule Take 1 capsule (40 mg total) by mouth 2 (two) times daily with a meal.  . azelastine (ASTELIN) 0.1 % nasal spray SMARTSIG:2 Spray(s) Both Nares Every 12 Hours PRN  . cetirizine (ZYRTEC) 10 MG tablet Take 10 mg by mouth daily.  Marland Kitchen  fluticasone (FLONASE) 50 MCG/ACT nasal spray SMARTSIG:2 Puff(s) Both Nares Daily  . Loratadine 10 MG CAPS Take 1 capsule by mouth daily.  . montelukast (SINGULAIR) 10 MG tablet Take 10 mg by mouth at bedtime.  . triamcinolone cream (KENALOG) 0.1 % Apply 1 application topically 2 (two) times daily.  . fluticasone (FLOVENT HFA) 110 MCG/ACT inhaler Inhale 1 puff into the lungs 2 (two) times daily.   No facility-administered encounter medications on file as of 12/30/2019.

## 2020-02-03 ENCOUNTER — Other Ambulatory Visit: Payer: Self-pay | Admitting: Otolaryngology

## 2020-02-03 DIAGNOSIS — R0602 Shortness of breath: Secondary | ICD-10-CM

## 2020-02-08 ENCOUNTER — Ambulatory Visit
Admission: RE | Admit: 2020-02-08 | Discharge: 2020-02-08 | Disposition: A | Discharge status: Home / Self Care | Payer: Commercial Managed Care - PPO | Source: Ambulatory Visit | Attending: Otolaryngology | Admitting: Otolaryngology

## 2020-02-08 ENCOUNTER — Other Ambulatory Visit: Payer: Self-pay

## 2020-02-08 DIAGNOSIS — R0602 Shortness of breath: Secondary | ICD-10-CM

## 2020-02-08 MED ORDER — IOHEXOL 300 MG/ML  SOLN
75.0000 mL | Freq: Once | INTRAMUSCULAR | Status: AC | PRN
  Administered 2020-02-08: 75 mL via INTRAVENOUS

## 2020-03-09 ENCOUNTER — Ambulatory Visit: Payer: Commercial Managed Care - PPO | Admitting: Family Medicine

## 2020-03-09 ENCOUNTER — Encounter: Payer: Self-pay | Admitting: Family Medicine

## 2020-03-09 ENCOUNTER — Other Ambulatory Visit: Payer: Self-pay

## 2020-03-09 VITALS — BP 130/84 | HR 86 | Temp 97.7°F | Ht 72.0 in | Wt 269.5 lb

## 2020-03-09 DIAGNOSIS — F900 Attention-deficit hyperactivity disorder, predominantly inattentive type: Secondary | ICD-10-CM | POA: Diagnosis not present

## 2020-03-09 DIAGNOSIS — F329 Major depressive disorder, single episode, unspecified: Secondary | ICD-10-CM | POA: Diagnosis not present

## 2020-03-09 DIAGNOSIS — J4520 Mild intermittent asthma, uncomplicated: Secondary | ICD-10-CM | POA: Diagnosis not present

## 2020-03-09 DIAGNOSIS — F32A Depression, unspecified: Secondary | ICD-10-CM

## 2020-03-09 MED ORDER — ATOMOXETINE HCL 100 MG PO CAPS: 100.0000 mg | ORAL_CAPSULE | Freq: Every day | ORAL | 3 refills | Status: DC

## 2020-03-09 MED ORDER — FLUOXETINE HCL 20 MG PO CAPS: 20.0000 mg | ORAL_CAPSULE | Freq: Every day | ORAL | 3 refills | Status: DC

## 2020-03-09 MED ORDER — SPACER/AERO-HOLDING CHAMBERS DEVI: 0 refills | Status: DC

## 2020-03-09 NOTE — Progress Notes (Signed)
Adriena Manfre T. Sharalyn Lomba, MD, CAQ Sports Medicine  Primary Care and Sports Medicine [facility] at [facility] [address] [place], [address]  Phone: [phone]  FAX: [phone]  Tristan Mcgee - 39 y.o. male  MRN [id number]  Date of Birth: 05-05-1981  Date: 03/09/2020  PCP: Hannah Beat, MD  Referral: Hannah Beat, MD  Chief Complaint  Patient presents with  . ADHD  . Depression  . Asthma    Ongoing breathing issues    This visit occurred during the SARS-CoV-2 public health emergency.  Safety protocols were in place, including screening questions prior to the visit, additional usage of staff PPE, and extensive cleaning of exam room while observing appropriate contact time as indicated for disinfecting solutions.   Subjective:   Tristan Mcgee is a 39 y.o. very pleasant male patient with Body mass index is 36.55 kg/m. who presents with the following:  Multiple problems:  ADHD -he thinks that his ADD is somewhat controlled, but it is not fully well controlled.  Thinks that he is doing somewhat worse compared to when he was on a stimulant medication, but our last office visit now he wanted to try titrating off of this.  He currently has been taking Strattera 40 mg p.o. twice daily.  Dep - on buspar.  His psychologist through work encouraged to this.  He is not having much relief of symptoms.  His sleeping is more or less okay with decreased energy and decreased interest in doing things.  He denies frank guilt.  No SI or HI.  Asthma - flevent is not helping at all.   Does not effect when not exercising He does not use a spacer and is only really getting some wheezing or shortness of breath with exercise.  Over the last couple of months. Now having some counselling on work.   Gave some buspar at work.  Some supplements.   Mood is getting.   Stays asleep ok.  Decreased interest and energy.  Lost best friend last fall.  No guilt.  Concentrartion  may have been worse.  Job is hectic.  Exp at work so managing others.   Going to the gym at [employer].   Review of Systems is noted in the HPI, as appropriate  Objective:   BP 130/84   Pulse 86   Temp 97.7 F (36.5 C) (Temporal)   Ht 6' (1.829 m)   Wt 269 lb 8 oz (122.2 kg)   SpO2 98%   BMI 36.55 kg/m   GEN: No acute distress; alert,appropriate. PSYCH: Normally interactive.  PULM: Normal respiratory rate, no accessory muscle use. No wheezes, crackles or rhonchi   Laboratory and Imaging Data:  Assessment and Plan:     ICD-10-CM   1. Attention deficit hyperactivity disorder (ADHD), predominantly inattentive type  F90.0   2. Depression, acute  F32.9   3. Asthma  J45.20    In terms of his ADD, I am going to increase his Strattera to only 100 mg in the morning.  I would like to max this out prior to having him go back on stimulants.  Acute depression, discontinue BuSpar and start him on Prozac daily.  He is going to continue with counseling through work.  Asthma that is primarily with exercise right now.  I Ernie Hew have him use a spacer, and I think that preexercise albuterol will really be all that he needs.  Follow-up: Return in about 6 weeks (around 04/20/2020).  Meds ordered  this encounter  Medications  . FLUoxetine (PROZAC) 20 MG capsule    Sig: Take 1 capsule (20 mg total) by mouth daily.    Dispense:  30 capsule    Refill:  3  . Spacer/Aero-Holding Chambers DEVI    Sig: Use while using albuterol, 2 puffs prn q 8 hours    Dispense:  1 Device    Refill:  0  . atomoxetine (STRATTERA) 100 MG capsule    Sig: Take 1 capsule (100 mg total) by mouth daily.    Dispense:  30 capsule    Refill:  3   Medications Discontinued During This Encounter  Medication Reason  . triamcinolone cream (KENALOG) 0.1 % Completed Course  . fluticasone (FLOVENT HFA) 110 MCG/ACT inhaler   . fluticasone (FLOVENT HFA) 110 MCG/ACT inhaler   . busPIRone (BUSPAR) 15 MG tablet   . atomoxetine  (STRATTERA) 40 MG capsule    No orders of the defined types were placed in this encounter.   Signed,  Maud Deed. Burdell Peed, MD   Outpatient Encounter Medications as of 03/09/2020  Medication Sig  . albuterol (PROVENTIL HFA;VENTOLIN HFA) 108 (90 BASE) MCG/ACT inhaler Inhale 2 puffs into the lungs every 6 (six) hours as needed.  Marland Kitchen atomoxetine (STRATTERA) 100 MG capsule Take 1 capsule (100 mg total) by mouth daily.  Marland Kitchen azelastine (ASTELIN) 0.1 % nasal spray SMARTSIG:2 Spray(s) Both Nares Every 12 Hours PRN  . cetirizine (ZYRTEC) 10 MG tablet Take 10 mg by mouth daily.  . fluticasone (FLONASE) 50 MCG/ACT nasal spray SMARTSIG:2 Puff(s) Both Nares Daily  . Loratadine 10 MG CAPS Take 1 capsule by mouth daily.  . montelukast (SINGULAIR) 10 MG tablet Take 10 mg by mouth at bedtime.  Marland Kitchen omeprazole (PRILOSEC) 20 MG capsule Take 20 mg by mouth every morning.  . [DISCONTINUED] atomoxetine (STRATTERA) 40 MG capsule Take 1 capsule (40 mg total) by mouth 2 (two) times daily with a meal.  . [DISCONTINUED] busPIRone (BUSPAR) 15 MG tablet Take 15 mg by mouth 2 (two) times daily.  . [DISCONTINUED] fluticasone (FLOVENT HFA) 110 MCG/ACT inhaler Inhale 1 puff into the lungs 2 (two) times daily.  Marland Kitchen FLUoxetine (PROZAC) 20 MG capsule Take 1 capsule (20 mg total) by mouth daily.  Marland Kitchen Spacer/Aero-Holding Chambers DEVI Use while using albuterol, 2 puffs prn q 8 hours  . [DISCONTINUED] triamcinolone cream (KENALOG) 0.1 % Apply 1 application topically 2 (two) times daily.   No facility-administered encounter medications on file as of 03/09/2020.

## 2020-04-19 ENCOUNTER — Other Ambulatory Visit: Payer: Self-pay

## 2020-04-20 ENCOUNTER — Ambulatory Visit: Payer: Commercial Managed Care - PPO | Admitting: Family Medicine

## 2020-04-20 ENCOUNTER — Encounter: Payer: Self-pay | Admitting: Family Medicine

## 2020-04-20 VITALS — BP 120/80 | HR 83 | Temp 97.5°F | Ht 72.0 in | Wt 275.8 lb

## 2020-04-20 DIAGNOSIS — F32A Depression, unspecified: Secondary | ICD-10-CM

## 2020-04-20 DIAGNOSIS — F329 Major depressive disorder, single episode, unspecified: Secondary | ICD-10-CM

## 2020-04-20 DIAGNOSIS — F900 Attention-deficit hyperactivity disorder, predominantly inattentive type: Secondary | ICD-10-CM | POA: Diagnosis not present

## 2020-04-20 NOTE — Progress Notes (Signed)
Tristan Mcgee T. Tristan Merriweather, MD, [place] at [place], [address]  Phone: [phone]  FAX: [name]  MRN [id number]  Date of Birth: 1981/09/13  Date: 04/20/2020  PCP: Tristan Loffler, MD  Referral: Tristan Loffler, MD  Chief Complaint  Patient presents with  . Follow-up    Medication Change    This visit occurred during the SARS-CoV-2 public health emergency.  Safety protocols were in place, including screening questions prior to the visit, additional usage of staff PPE, and extensive cleaning of exam room while observing appropriate contact time as indicated for disinfecting solutions.   Subjective:   Tristan Mcgee is a 39 y.o. very pleasant male patient with Body mass index is 37.4 kg/m. who presents with the following:  Last time, I started him on Prozac 20 mg, and we increased his strattera to 100 mg.  Mood swings are getting better.    Conventration is still not there.   Mood is better. Was out a couple of weeks ago, then everyone was out.  Much more work at job. Hard to make an objective assessment.   No guilt.  Last weekend was bad.  Dogs were sick  Wt Readings from Last 3 Encounters:  04/20/20 275 lb 12 oz (125.1 kg)  03/09/20 269 lb 8 oz (122.2 kg)  12/30/19 268 lb 8 oz (121.8 kg)      Review of Systems is noted in the HPI, as appropriate  Objective:   BP 120/80   Pulse 83   Temp (!) 97.5 F (36.4 C) (Temporal)   Ht 6' (1.829 m)   Wt 275 lb 12 oz (125.1 kg)   SpO2 97%   BMI 37.40 kg/m   GEN: No acute distress; alert,appropriate. PULM: Breathing comfortably in no respiratory distress PSYCH: Normally interactive.   Laboratory and Imaging Data:  Assessment and Plan:     ICD-10-CM   1. Depression, acute  F32.9   2. Attention deficit hyperactivity disorder (ADHD), predominantly inattentive type  F90.0     Globally he is doing better with his changes in his regimen.  He feels much less sad and depressed after starting on his Prozac.  He is having neither anhedonia nor crying spells.  He is not having any guilt.  He is feeling more energized.  He has no SI or HI.  He is not sure if his attention might still be less compared with being on his stimulants, but for now we would like to take a holiday from this.  Follow-up: Return in about 6 months (around 10/20/2020) for health maintenance exam.  No orders of the defined types were placed in this encounter.  There are no discontinued medications. No orders of the defined types were placed in this encounter.   Signed,  Tristan Deed. Bentleigh Waren, MD   Outpatient Encounter Medications as of 04/20/2020  Medication Sig  . albuterol (PROVENTIL HFA;VENTOLIN HFA) 108 (90 BASE) MCG/ACT inhaler Inhale 2 puffs into the lungs every 6 (six) hours as needed.  Marland Kitchen atomoxetine (STRATTERA) 100 MG capsule Take 1 capsule (100 mg total) by mouth daily.  Marland Kitchen azelastine (ASTELIN) 0.1 % nasal spray SMARTSIG:2 Spray(s) Both Nares Every 12 Hours PRN  . cetirizine (ZYRTEC) 10 MG tablet Take 10 mg by mouth daily.  Marland Kitchen FLUoxetine (PROZAC) 20 MG capsule Take 1 capsule (20 mg total) by mouth daily.  Marland Kitchen  fluticasone (FLONASE) 50 MCG/ACT nasal spray SMARTSIG:2 Puff(s) Both Nares Daily  . Loratadine 10 MG CAPS Take 1 capsule by mouth daily.  . montelukast (SINGULAIR) 10 MG tablet Take 10 mg by mouth at bedtime.  Marland Kitchen omeprazole (PRILOSEC) 20 MG capsule Take 20 mg by mouth every morning.  Marland Kitchen Spacer/Aero-Holding Rudean Curt Use while using albuterol, 2 puffs prn q 8 hours   No facility-administered encounter medications on file as of 04/20/2020.

## 2020-05-29 ENCOUNTER — Other Ambulatory Visit: Payer: Self-pay | Admitting: Family Medicine

## 2020-05-29 NOTE — Telephone Encounter (Signed)
Last office visit 04/20/2020 for depression & ADHD.  Last refilled 03/09/2020 for #30 with 3 refills.  No future appointments.

## 2020-07-04 ENCOUNTER — Other Ambulatory Visit: Payer: Self-pay | Admitting: Family Medicine

## 2020-07-13 ENCOUNTER — Other Ambulatory Visit: Payer: Self-pay

## 2020-07-13 ENCOUNTER — Encounter: Payer: Self-pay | Admitting: Family Medicine

## 2020-07-13 ENCOUNTER — Ambulatory Visit: Payer: Commercial Managed Care - PPO | Admitting: Family Medicine

## 2020-07-13 VITALS — BP 120/72 | HR 98 | Temp 98.4°F | Ht 72.0 in | Wt 281.5 lb

## 2020-07-13 DIAGNOSIS — F908 Attention-deficit hyperactivity disorder, other type: Secondary | ICD-10-CM

## 2020-07-13 DIAGNOSIS — M7918 Myalgia, other site: Secondary | ICD-10-CM | POA: Diagnosis not present

## 2020-07-13 MED ORDER — METHYLPHENIDATE HCL ER (OSM) 36 MG PO TBCR: 36.0000 mg | EXTENDED_RELEASE_TABLET | Freq: Every day | ORAL | 0 refills | Status: DC

## 2020-07-13 NOTE — Progress Notes (Signed)
Conor Lata T. Zaven Klemens, MD, CAQ Sports Medicine  Primary Care and Sports Medicine [facility] at [facility] [address] [place], [address]  Phone: [phone]  FAX: [phone]  CRU KRITIKOS - 39 y.o. male  MRN [id number]  Date of Birth: 27-Jan-1981  Date: 07/13/2020  PCP: Hannah Beat, MD  Referral: Hannah Beat, MD  Chief Complaint  Patient presents with  . ADHD  . Back Pain    Under left shoulder blade with breathing issue    This visit occurred during the SARS-CoV-2 public health emergency.  Safety protocols were in place, including screening questions prior to the visit, additional usage of staff PPE, and extensive cleaning of exam room while observing appropriate contact time as indicated for disinfecting solutions.   Subjective:   AYUSH BOULET is a 39 y.o. very pleasant male patient with Body mass index is 38.18 kg/m. who presents with the following:  He is a pleasant gentleman and he is here today in follow-up regarding his ADHD.  He has been on maximum strength Strattera, and he still having a lot of breakthrough ADD symptoms and it is primarily bothering him at work and his functionality at work and production.  Previously had been on Concerta and did fairly well with this, but he wanted to do a trial off of stimulants.  He was this his middle back pain that is not radicular in character and it is predominant at the right side of his lower scapula.  He is actually here for back pain, as well.  Strattera is not working as well.   Primry issue is at work.     Review of Systems is noted in the HPI, as appropriate  Objective:   BP 120/72   Pulse 98   Temp 98.4 F (36.9 C) (Temporal)   Ht 6' (1.829 m)   Wt 281 lb 8 oz (127.7 kg)   SpO2 98%   BMI 38.18 kg/m   GEN: No acute distress; alert,appropriate. PULM: Breathing comfortably in no respiratory distress PSYCH: Normally interactive.   C-spine has normal range of  motion with out any Spurling's.  Full range of motion at the shoulder with 5/5 strength and a negative Leanord Asal test as well as a negative Neer test.  He does have some tenderness in the lower scapular border on the right.  Does have somewhat his forward sloping scapula.  Laboratory and Imaging Data:  Assessment and Plan:     ICD-10-CM   1. Attention deficit hyperactivity disorder (ADHD), other type  F90.8   2. Rhomboid pain  M79.18    Worsened ADHD.  Discontinue Strattera and start at a lower dose of Concerta.  Reassurance regarding his rhomboid and lower trap pain.  Alteration of some workouts in the gym to work on lower scapular stabilization as well as pulling movements.  Follow-up: Return for follow-up at physical.  Meds ordered this encounter  Medications  . methylphenidate (CONCERTA) 36 MG PO CR tablet    Sig: Take 1 tablet (36 mg total) by mouth daily.    Dispense:  30 tablet    Refill:  0  . methylphenidate (CONCERTA) 36 MG PO CR tablet    Sig: Take 1 tablet (36 mg total) by mouth daily.    Dispense:  30 tablet    Refill:  0    Do not refill until 08/12/2020   Medications Discontinued During This Encounter  Medication Reason  . atomoxetine (STRATTERA) 100 MG capsule  No orders of the defined types were placed in this encounter.   Signed,  Elpidio Galea. Wells Gerdeman, MD   Outpatient Encounter Medications as of 07/13/2020  Medication Sig  . albuterol (PROVENTIL HFA;VENTOLIN HFA) 108 (90 BASE) MCG/ACT inhaler Inhale 2 puffs into the lungs every 6 (six) hours as needed.  Marland Kitchen azelastine (ASTELIN) 0.1 % nasal spray SMARTSIG:2 Spray(s) Both Nares Every 12 Hours PRN  . cetirizine (ZYRTEC) 10 MG tablet Take 10 mg by mouth daily.  Marland Kitchen FLUoxetine (PROZAC) 20 MG capsule TAKE 1 CAPSULE BY MOUTH ONCE DAILY  . fluticasone (FLONASE) 50 MCG/ACT nasal spray SMARTSIG:2 Puff(s) Both Nares Daily  . Loratadine 10 MG CAPS Take 1 capsule by mouth daily.  . montelukast (SINGULAIR) 10 MG  tablet Take 10 mg by mouth at bedtime.  Marland Kitchen omeprazole (PRILOSEC) 20 MG capsule Take 20 mg by mouth every morning.  Marland Kitchen Spacer/Aero-Holding Chambers DEVI Use while using albuterol, 2 puffs prn q 8 hours  . [DISCONTINUED] atomoxetine (STRATTERA) 100 MG capsule TAKE 1 CAPSULE BY MOUTH DAILY  . methylphenidate (CONCERTA) 36 MG PO CR tablet Take 1 tablet (36 mg total) by mouth daily.  . methylphenidate (CONCERTA) 36 MG PO CR tablet Take 1 tablet (36 mg total) by mouth daily.   No facility-administered encounter medications on file as of 07/13/2020.

## 2020-08-12 ENCOUNTER — Other Ambulatory Visit: Payer: Self-pay | Admitting: Family Medicine

## 2020-09-14 ENCOUNTER — Other Ambulatory Visit: Payer: Self-pay | Admitting: Family Medicine

## 2020-09-15 NOTE — Telephone Encounter (Signed)
Last written 08-12-20 #30 Last OV 07-13-20 Next OV 10-02-20  Patient comment: I think we had discussed coming in for my annual before requesting a second refill, but I was out of town the first week of November and picked up a (non-COVID thankfully) sinus issue. I put in my appointment request for the physical for next week / week after but unless it is early in the week I'll need a refill before then (and I think you'll want labs before the annual so that may push it out even further).

## 2020-09-18 MED ORDER — METHYLPHENIDATE HCL ER (OSM) 36 MG PO TBCR: 36.0000 mg | EXTENDED_RELEASE_TABLET | Freq: Every day | ORAL | 0 refills | Status: DC

## 2020-09-25 ENCOUNTER — Other Ambulatory Visit (INDEPENDENT_AMBULATORY_CARE_PROVIDER_SITE_OTHER): Payer: Commercial Managed Care - PPO

## 2020-09-25 ENCOUNTER — Other Ambulatory Visit: Payer: Commercial Managed Care - PPO

## 2020-09-25 ENCOUNTER — Other Ambulatory Visit: Payer: Self-pay

## 2020-09-25 DIAGNOSIS — Z131 Encounter for screening for diabetes mellitus: Secondary | ICD-10-CM

## 2020-09-25 DIAGNOSIS — Z13220 Encounter for screening for lipoid disorders: Secondary | ICD-10-CM

## 2020-09-25 DIAGNOSIS — Z79899 Other long term (current) drug therapy: Secondary | ICD-10-CM

## 2020-09-25 LAB — CBC WITH DIFFERENTIAL/PLATELET
Basophils Absolute: 0.1 10*3/uL (ref 0.0–0.1)
Basophils Relative: 1.2 % (ref 0.0–3.0)
Eosinophils Absolute: 0.4 10*3/uL (ref 0.0–0.7)
Eosinophils Relative: 8.3 % — ABNORMAL HIGH (ref 0.0–5.0)
HCT: 40.6 % (ref 39.0–52.0)
Hemoglobin: 13.6 g/dL (ref 13.0–17.0)
Lymphocytes Relative: 31.6 % (ref 12.0–46.0)
Lymphs Abs: 1.4 10*3/uL (ref 0.7–4.0)
MCHC: 33.4 g/dL (ref 30.0–36.0)
MCV: 88.9 fl (ref 78.0–100.0)
Monocytes Absolute: 0.5 10*3/uL (ref 0.1–1.0)
Monocytes Relative: 11.8 % (ref 3.0–12.0)
Neutro Abs: 2.1 10*3/uL (ref 1.4–7.7)
Neutrophils Relative %: 47.1 % (ref 43.0–77.0)
Platelets: 255 10*3/uL (ref 150.0–400.0)
RBC: 4.57 Mil/uL (ref 4.22–5.81)
RDW: 13.4 % (ref 11.5–15.5)
WBC: 4.6 10*3/uL (ref 4.0–10.5)

## 2020-09-25 LAB — LIPID PANEL
Cholesterol: 164 mg/dL (ref 0–200)
HDL: 59.6 mg/dL (ref >39.00)
LDL Cholesterol: 87 mg/dL (ref 0–99)
NonHDL: 104.06
Total CHOL/HDL Ratio: 3
Triglycerides: 84 mg/dL (ref 0.0–149.0)
VLDL: 16.8 mg/dL (ref 0.0–40.0)

## 2020-09-25 LAB — HEPATIC FUNCTION PANEL
ALT: 17 U/L (ref 0–53)
AST: 28 U/L (ref 0–37)
Albumin: 4.2 g/dL (ref 3.5–5.2)
Alkaline Phosphatase: 43 U/L (ref 39–117)
Bilirubin, Direct: 0.2 mg/dL (ref 0.0–0.3)
Total Bilirubin: 0.9 mg/dL (ref 0.2–1.2)
Total Protein: 6.7 g/dL (ref 6.0–8.3)

## 2020-09-25 LAB — BASIC METABOLIC PANEL
BUN: 16 mg/dL (ref 6–23)
CO2: 29 mEq/L (ref 19–32)
Calcium: 9.2 mg/dL (ref 8.4–10.5)
Chloride: 104 mEq/L (ref 96–112)
Creatinine, Ser: 1.18 mg/dL (ref 0.40–1.50)
GFR: 78.41 mL/min (ref >60.00)
Glucose, Bld: 87 mg/dL (ref 70–99)
Potassium: 4.7 mEq/L (ref 3.5–5.1)
Sodium: 138 mEq/L (ref 135–145)

## 2020-09-25 LAB — HEMOGLOBIN A1C: Hgb A1c MFr Bld: 4.9 % (ref 4.6–6.5)

## 2020-10-01 NOTE — Progress Notes (Signed)
Melina Mosteller T. Renna Kilmer, MD, CAQ Sports Medicine  Primary Care and Sports Medicine [place] [facility] [address] [place], [address]  Phone: [phone]   FAX: [phone]  Tristan Mcgee - 39 y.o. male   MRN [id number]   Date of Birth: Nov 10, 1980  Date: 10/02/2020   PCP: Hannah Beat, MD   Referral: Hannah Beat, MD  Chief Complaint  Patient presents with   Annual Exam    This visit occurred during the SARS-CoV-2 public health emergency.  Safety protocols were in place, including screening questions prior to the visit, additional usage of staff PPE, and extensive cleaning of exam room while observing appropriate contact time as indicated for disinfecting solutions.   Patient Care Team: Hannah Beat, MD as PCP - General Subjective:   Tristan Mcgee is a 38 y.o. pleasant patient who presents with the following:  Preventative Health Maintenance Visit:  Health Maintenance Summary Reviewed and updated, unless pt declines services.  Tobacco History Reviewed. Alcohol: No concerns, no excessive use STD concerns: no risk or activity to increase risk Drug Use: None  F/u ADHD 09/2019 - UDS  [employer] in [place] - circuit.  6 days a week, and weights  L lower trap, will sometimes hurt.    When flying left testicle will worsen with diving or flying.  Will swell sometimes.  Health Maintenance  Topic Date Due   Hepatitis C Screening  Never done   TETANUS/TDAP  05/16/2026   INFLUENZA VACCINE  Completed   COVID-19 Vaccine  Completed   HIV Screening  Completed   Immunization History  Administered Date(s) Administered   Influenza,inj,Quad PF,6+ Mos 07/31/2017, 08/19/2018   Influenza-Unspecified 08/26/2019, 08/23/2020   Moderna SARS-COVID-2 Vaccination 01/20/2020, 02/17/2020, 09/27/2020   Tdap 05/16/2016   Patient Active Problem List   Diagnosis Date Noted   Left varicocele 10/02/2020   Concussion without loss  of consciousness 09/21/2015   H/O bariatric surgery (SLEEVE) 09/21/2015   Asthma 11/01/2010   Hypertension 10/17/2010   Attention deficit disorder 08/23/2009    Past Medical History:  Diagnosis Date   ADD (attention deficit disorder)    Allergic rhinitis due to pollen    Asthma    Concussion without loss of consciousness 09/21/2015   H/O bariatric surgery (SLEEVE) 09/21/2015   Hypertension 10/17/2010    Past Surgical History:  Procedure Laterality Date   CHOLECYSTECTOMY     SLEEVE GASTROPLASTY  2015    Family History  Problem Relation Age of Onset   Hypertension Father    Cancer Maternal Grandmother    Heart disease Maternal Grandfather    Colon cancer Paternal Grandmother    Heart disease Paternal Grandfather     Past Medical History, Surgical History, Social History, Family History, Problem List, Medications, and Allergies have been reviewed and updated if relevant.  Review of Systems: Pertinent positives are listed above.  Otherwise, a full 14 point review of systems has been done in full and it is negative except where it is noted positive.  Objective:   BP 120/90    Pulse 69    Temp 98.1 F (36.7 C) (Temporal)    Ht 6' 0.25" (1.835 m)    Wt 279 lb (126.6 kg)    SpO2 98%    BMI 37.58 kg/m  Ideal Body Weight: Weight in (lb) to have BMI = 25: 185.2  Ideal Body Weight: Weight in (lb) to have BMI = 25: 185.2 No exam data present Depression screen  [facility] 2/9 10/02/2020 04/20/2020 09/13/2019 08/19/2018 07/31/2017  Decreased Interest 0 0 0 0 0  Down, Depressed, Hopeless 0 0 0 0 0  PHQ - 2 Score 0 0 0 0 0  Altered sleeping - 1 - - -  Tired, decreased energy - 1 - - -  Change in appetite - 1 - - -  Feeling bad or failure about yourself  - 0 - - -  Trouble concentrating - 2 - - -  Moving slowly or fidgety/restless - 0 - - -  Suicidal thoughts - 0 - - -  PHQ-9 Score - 5 - - -  Difficult doing work/chores - Somewhat difficult - - -     GEN: well  developed, well nourished, no acute distress Eyes: conjunctiva and lids normal, PERRLA, EOMI ENT: TM clear, nares clear, oral exam WNL Neck: supple, no lymphadenopathy, no thyromegaly, no JVD Pulm: clear to auscultation and percussion, respiratory effort normal CV: regular rate and rhythm, S1-S2, no murmur, rub or gallop, no bruits, peripheral pulses normal and symmetric, no cyanosis, clubbing, edema or varicosities GI: soft, non-tender; no hepatosplenomegaly, masses; active bowel sounds all quadrants GU: normal male, with varicocele on the L Lymph: no cervical, axillary or inguinal adenopathy MSK: gait normal, muscle tone and strength WNL, no joint swelling, effusions, discoloration, crepitus  SKIN: clear, good turgor, color WNL, no rashes, lesions, or ulcerations Neuro: normal mental status, normal strength, sensation, and motion Psych: alert; oriented to person, place and time, normally interactive and not anxious or depressed in appearance.  All labs reviewed with patient. Results for orders placed or performed in visit on 09/25/20  Lipid panel  Result Value Ref Range   Cholesterol 164 0 - 200 mg/dL   Triglycerides 69.6 0 - 149 mg/dL   HDL 29.52 >84.13 mg/dL   VLDL 24.4 0.0 - 01.0 mg/dL   LDL Cholesterol 87 0 - 99 mg/dL   Total CHOL/HDL Ratio 3    NonHDL 104.06   CBC with Differential/Platelet  Result Value Ref Range   WBC 4.6 4.0 - 10.5 K/uL   RBC 4.57 4.22 - 5.81 Mil/uL   Hemoglobin 13.6 13.0 - 17.0 g/dL   HCT 27.2 39 - 52 %   MCV 88.9 78.0 - 100.0 fl   MCHC 33.4 30.0 - 36.0 g/dL   RDW 53.6 64.4 - 03.4 %   Platelets 255.0 150 - 400 K/uL   Neutrophils Relative % 47.1 43 - 77 %   Lymphocytes Relative 31.6 12 - 46 %   Monocytes Relative 11.8 3 - 12 %   Eosinophils Relative 8.3 (H) 0 - 5 %   Basophils Relative 1.2 0 - 3 %   Neutro Abs 2.1 1.4 - 7.7 K/uL   Lymphs Abs 1.4 0.7 - 4.0 K/uL   Monocytes Absolute 0.5 0.1 - 1.0 K/uL   Eosinophils Absolute 0.4 0.0 - 0.7 K/uL    Basophils Absolute 0.1 0.0 - 0.1 K/uL  Hepatic function panel  Result Value Ref Range   Total Bilirubin 0.9 0.2 - 1.2 mg/dL   Bilirubin, Direct 0.2 0.0 - 0.3 mg/dL   Alkaline Phosphatase 43 39 - 117 U/L   AST 28 0 - 37 U/L   ALT 17 0 - 53 U/L   Total Protein 6.7 6.0 - 8.3 g/dL   Albumin 4.2 3.5 - 5.2 g/dL  Basic metabolic panel  Result Value Ref Range   Sodium 138 135 - 145 mEq/L   Potassium 4.7 3.5 - 5.1 mEq/L  Chloride 104 96 - 112 mEq/L   CO2 29 19 - 32 mEq/L   Glucose, Bld 87 70 - 99 mg/dL   BUN 16 6 - 23 mg/dL   Creatinine, Ser 9.60 0.40 - 1.50 mg/dL   GFR 45.40 >98.11 mL/min   Calcium 9.2 8.4 - 10.5 mg/dL  Hemoglobin B1Y  Result Value Ref Range   Hgb A1c MFr Bld 4.9 4.6 - 6.5 %    Assessment and Plan:     ICD-10-CM   1. Healthcare maintenance  Z00.00   2. Attention deficit hyperactivity disorder (ADHD), other type  F90.8 DRUG MONITORING, PANEL 8 WITH CONFIRMATION, URINE  3. Left varicocele  I86.1    Doing well - great with his fitness and weight in check  Check UDS for concerta.  Tylenol or advil before flying  Health Maintenance Exam: The patient's preventative maintenance and recommended screening tests for an annual wellness exam were reviewed in full today. Brought up to date unless services declined.  Counselled on the importance of diet, exercise, and its role in overall health and mortality. The patient's FH and SH was reviewed, including their home life, tobacco status, and drug and alcohol status.  Follow-up in 1 year for physical exam or additional follow-up below.  Follow-up: No follow-ups on file. Or follow-up in 1 year if not noted.  No orders of the defined types were placed in this encounter.  Medications Discontinued During This Encounter  Medication Reason   azelastine (ASTELIN) 0.1 % nasal spray Completed Course   Orders Placed This Encounter  Procedures   DRUG MONITORING, PANEL 8 WITH CONFIRMATION, URINE    Signed,  Sadhana Frater T.  Koralynn Greenspan, MD   Allergies as of 10/02/2020      Reactions   Bactrim [sulfamethoxazole-trimethoprim] Other (See Comments)   Joint & Muscle Pain   Clindamycin/lincomycin Other (See Comments)   C-Diff Infection      Medication List       Accurate as of October 02, 2020  9:37 AM. If you have any questions, ask your nurse or doctor.        STOP taking these medications   azelastine 0.1 % nasal spray Commonly known as: ASTELIN Stopped by: Hannah Beat, MD     TAKE these medications   albuterol 108 (90 Base) MCG/ACT inhaler Commonly known as: VENTOLIN HFA Inhale 2 puffs into the lungs every 6 (six) hours as needed.   cetirizine 10 MG tablet Commonly known as: ZYRTEC Take 10 mg by mouth daily.   FLUoxetine 20 MG capsule Commonly known as: PROZAC TAKE 1 CAPSULE BY MOUTH ONCE DAILY   fluticasone 50 MCG/ACT nasal spray Commonly known as: FLONASE SMARTSIG:2 Puff(s) Both Nares Daily   Loratadine 10 MG Caps Take 1 capsule by mouth daily.   methylphenidate 36 MG CR tablet Commonly known as: Concerta Take 1 tablet (36 mg total) by mouth daily.   methylphenidate 36 MG CR tablet Commonly known as: Concerta Take 1 tablet (36 mg total) by mouth daily.   montelukast 10 MG tablet Commonly known as: SINGULAIR Take 10 mg by mouth at bedtime.   omeprazole 20 MG capsule Commonly known as: PRILOSEC Take 20 mg by mouth every morning.   Spacer/Aero-Holding [employer] Use while using albuterol, 2 puffs prn q 8 hours

## 2020-10-02 ENCOUNTER — Ambulatory Visit (INDEPENDENT_AMBULATORY_CARE_PROVIDER_SITE_OTHER): Payer: Commercial Managed Care - PPO | Admitting: Family Medicine

## 2020-10-02 ENCOUNTER — Encounter: Payer: Commercial Managed Care - PPO | Admitting: Family Medicine

## 2020-10-02 ENCOUNTER — Encounter: Payer: Self-pay | Admitting: Family Medicine

## 2020-10-02 ENCOUNTER — Other Ambulatory Visit: Payer: Self-pay

## 2020-10-02 VITALS — BP 120/90 | HR 69 | Temp 98.1°F | Ht 72.25 in | Wt 279.0 lb

## 2020-10-02 DIAGNOSIS — I861 Scrotal varices: Secondary | ICD-10-CM | POA: Diagnosis not present

## 2020-10-02 DIAGNOSIS — F908 Attention-deficit hyperactivity disorder, other type: Secondary | ICD-10-CM

## 2020-10-02 DIAGNOSIS — Z0000 Encounter for general adult medical examination without abnormal findings: Secondary | ICD-10-CM

## 2020-10-04 LAB — DRUG MONITORING, PANEL 8 WITH CONFIRMATION, URINE
6 Acetylmorphine: NEGATIVE ng/mL (ref <10)
Alcohol Metabolites: POSITIVE ng/mL — AB
Amphetamines: NEGATIVE ng/mL (ref <500)
Benzodiazepines: NEGATIVE ng/mL (ref <100)
Buprenorphine, Urine: NEGATIVE ng/mL (ref <5)
Cocaine Metabolite: NEGATIVE ng/mL (ref <150)
Creatinine: 77 mg/dL
Ethyl Glucuronide (ETG): 1236 ng/mL — ABNORMAL HIGH (ref <500)
Ethyl Sulfate (ETS): 369 ng/mL — ABNORMAL HIGH (ref <100)
MDMA: NEGATIVE ng/mL (ref <500)
Marijuana Metabolite: NEGATIVE ng/mL (ref <20)
Opiates: NEGATIVE ng/mL (ref <100)
Oxidant: NEGATIVE ug/mL
Oxycodone: NEGATIVE ng/mL (ref <100)
pH: 7.7 (ref 4.5–9.0)

## 2020-10-04 LAB — DM TEMPLATE

## 2020-10-12 ENCOUNTER — Other Ambulatory Visit: Payer: Self-pay | Admitting: Family Medicine

## 2020-10-12 DIAGNOSIS — F908 Attention-deficit hyperactivity disorder, other type: Secondary | ICD-10-CM

## 2020-10-12 MED ORDER — METHYLPHENIDATE HCL ER (OSM) 36 MG PO TBCR: 36.0000 mg | EXTENDED_RELEASE_TABLET | Freq: Every day | ORAL | 0 refills | Status: DC

## 2020-10-12 MED ORDER — FLUOXETINE HCL 20 MG PO CAPS: 20.0000 mg | ORAL_CAPSULE | Freq: Every day | ORAL | 1 refills | Status: DC

## 2020-10-12 NOTE — Telephone Encounter (Signed)
Last office visit 10/02/2020 for CPE.  Last refilled 07/13/2020 for #30 with no refills x 3 months.  No future appointments.

## 2021-01-15 ENCOUNTER — Other Ambulatory Visit: Payer: Self-pay | Admitting: Family Medicine

## 2021-01-22 ENCOUNTER — Other Ambulatory Visit: Payer: Self-pay

## 2021-01-22 DIAGNOSIS — F908 Attention-deficit hyperactivity disorder, other type: Secondary | ICD-10-CM

## 2021-01-22 MED ORDER — FLUCONAZOLE 150 MG PO TABS: ORAL_TABLET | ORAL | 0 refills | Status: DC

## 2021-01-22 MED ORDER — METHYLPHENIDATE HCL ER (OSM) 36 MG PO TBCR: 36.0000 mg | EXTENDED_RELEASE_TABLET | Freq: Every day | ORAL | 0 refills | Status: DC

## 2021-01-22 NOTE — Telephone Encounter (Signed)
Last office visit 10/02/2020 for CPE.  Last refilled 10/12/2020 for #30 with no refills x 3 months.  UDS/Contract 10/02/2020.  No future appointments.

## 2021-03-23 ENCOUNTER — Ambulatory Visit
Admission: EM | Admit: 2021-03-23 | Discharge: 2021-03-23 | Disposition: A | Discharge status: Home / Self Care | Payer: Commercial Managed Care - PPO | Attending: Emergency Medicine | Admitting: Emergency Medicine

## 2021-03-23 ENCOUNTER — Other Ambulatory Visit: Payer: Self-pay

## 2021-03-23 DIAGNOSIS — R197 Diarrhea, unspecified: Secondary | ICD-10-CM | POA: Diagnosis present

## 2021-03-23 MED ORDER — DICYCLOMINE HCL 20 MG PO TABS: 20.0000 mg | ORAL_TABLET | Freq: Two times a day (BID) | ORAL | 0 refills | Status: DC

## 2021-03-23 MED ORDER — ONDANSETRON 8 MG PO TBDP: 8.0000 mg | ORAL_TABLET | Freq: Three times a day (TID) | ORAL | 0 refills | Status: DC | PRN

## 2021-03-23 NOTE — Discharge Instructions (Addendum)
Take the Zofran every 8 hours as needed for nausea.  Take the Bentyl every 6 hours as needed for abdominal cramping.  Continue to hydrate with fluids and continue your diet as tolerated.  Collect stool specimens, placed them in the proper containers, and return them to the lab here at the [facility].  Have any increase in your abdominal pain, you develop a fever, you develop vomiting and are unable to keep in fluids or food return for reevaluation or go to the ER.

## 2021-03-23 NOTE — ED Triage Notes (Addendum)
Pt c/o diarrhea and abdominal pain since Sunday. Pt was in [place] on a trip, returned Wednesday. Pt reports some improvement in the diarrhea (not as loose), however is concerned about C-Diff as he reports his stool has an odd smell to it. Pt also reports abd pain is increasing, across his lower abd. Pt denies f/n/v or other symptoms.

## 2021-03-23 NOTE — ED Provider Notes (Signed)
[facility]    CSN: [id number] Arrival date & time: 03/23/21  1544      History   Chief Complaint Chief Complaint  Patient presents with  . Diarrhea    HPI Tristan Mcgee is a 40 y.o. male.   HPI   40 year old male here for evaluation of abdominal pain and diarrhea.  Patient reports that he has been experiencing abdominal pain and diarrhea for the last 5 days.  His symptoms started while he was in [place] on a business trip.  He reports that for the first 2 days his stools were watery consistency and the last 3 days his stools have been thicker brown mush.  Patient denies seeing any blood in his stool but he does describe them as having a "sickeningly sweet odor".  Patient also reports he has had some mild nausea.  He is able to eat and drink, he is having 3-4 stools a day, and he has had no fever.  Past Medical History:  Diagnosis Date  . ADD (attention deficit disorder)   . Allergic rhinitis due to pollen   . Asthma   . Concussion without loss of consciousness 09/21/2015  . H/O bariatric surgery (SLEEVE) 09/21/2015  . Hypertension 10/17/2010    Patient Active Problem List   Diagnosis Date Noted  . Left varicocele 10/02/2020  . Concussion without loss of consciousness 09/21/2015  . H/O bariatric surgery (SLEEVE) 09/21/2015  . Asthma 11/01/2010  . Hypertension 10/17/2010  . Attention deficit disorder 08/23/2009    Past Surgical History:  Procedure Laterality Date  . CHOLECYSTECTOMY    . SLEEVE GASTROPLASTY  2015       Home Medications    Prior to Admission medications   Medication Sig Start Date End Date Taking? Authorizing Provider  albuterol (PROVENTIL HFA;VENTOLIN HFA) 108 (90 BASE) MCG/ACT inhaler Inhale 2 puffs into the lungs every 6 (six) hours as needed.   Yes [provider]  dicyclomine (BENTYL) 20 MG tablet Take 1 tablet (20 mg total) by mouth 2 (two) times daily. 03/23/21  Yes Margarette Canada, NP  EPINEPHrine 0.3 mg/0.3 mL IJ SOAJ  injection Inject into the muscle. 11/10/20  Yes [provider]  FLUoxetine (PROZAC) 20 MG capsule TAKE 1 CAPSULE BY MOUTH EVERY DAY 01/16/21  Yes Copland, Frederico Hamman, MD  fluticasone (FLONASE) 50 MCG/ACT nasal spray SMARTSIG:2 Puff(s) Both Nares Daily 11/26/19  Yes [provider]  Loratadine 10 MG CAPS Take 1 capsule by mouth daily.   Yes [provider]  methylphenidate (CONCERTA) 36 MG PO CR tablet Take 1 tablet (36 mg total) by mouth daily. 01/22/21  Yes Copland, Frederico Hamman, MD  montelukast (SINGULAIR) 10 MG tablet Take 10 mg by mouth at bedtime.   Yes [provider]  omeprazole (PRILOSEC) 20 MG capsule Take 20 mg by mouth every morning. 02/02/20  Yes [provider]  ondansetron (ZOFRAN ODT) 8 MG disintegrating tablet Take 1 tablet (8 mg total) by mouth every 8 (eight) hours as needed for nausea or vomiting. 03/23/21  Yes Margarette Canada, NP  methylphenidate (CONCERTA) 36 MG PO CR tablet Take 1 tablet (36 mg total) by mouth daily. 01/22/21   Copland, Frederico Hamman, MD  methylphenidate (CONCERTA) 36 MG PO CR tablet Take 1 tablet (36 mg total) by mouth daily. 01/22/21   Owens Loffler, MD  Spacer/Aero-Holding [facility] Use while using albuterol, 2 puffs prn q 8 hours 03/09/20   Copland, Frederico Hamman, MD  cetirizine (ZYRTEC) 10 MG tablet Take 10 mg by  mouth daily.  03/23/21  [provider]    Family History Family History  Problem Relation Age of Onset  . Hypertension Father   . Cancer Maternal Grandmother   . Heart disease Maternal Grandfather   . Colon cancer Paternal Grandmother   . Heart disease Paternal Grandfather     Social History Social History   Tobacco Use  . Smoking status: Never Smoker  . Smokeless tobacco: Never Used  Vaping Use  . Vaping Use: Never used  Substance Use Topics  . Alcohol use: Yes    Alcohol/week: 0.0 standard drinks    Comment: occ  . Drug use: No     Allergies   Bactrim [sulfamethoxazole-trimethoprim] and  Clindamycin/lincomycin   Review of Systems Review of Systems  Constitutional: Negative for activity change, appetite change and fever.  Gastrointestinal: Positive for abdominal pain, diarrhea and nausea. Negative for blood in stool and vomiting.  Genitourinary: Negative for decreased urine volume.  Hematological: Negative.   Psychiatric/Behavioral: Negative.      Physical Exam Triage Vital Signs ED Triage Vitals  Enc Vitals Group     BP 03/23/21 1555 (!) 143/81     Pulse Rate 03/23/21 1555 62     Resp 03/23/21 1555 18     Temp 03/23/21 1555 98.2 F (36.8 C)     Temp Source 03/23/21 1555 Oral     SpO2 03/23/21 1555 100 %     Weight 03/23/21 1553 265 lb (120.2 kg)     Height 03/23/21 1553 6' 1"  (1.854 m)     Head Circumference --      Peak Flow --      Pain Score 03/23/21 1553 3     Pain Loc --      Pain Edu? --      Excl. in [place]? --    No data found.  Updated Vital Signs BP (!) 143/81 (BP Location: Left Arm)   Pulse 62   Temp 98.2 F (36.8 C) (Oral)   Resp 18   Ht 6' 1"  (1.854 m)   Wt 265 lb (120.2 kg)   SpO2 100%   BMI 34.96 kg/m   Visual Acuity Right Eye Distance:   Left Eye Distance:   Bilateral Distance:    Right Eye Near:   Left Eye Near:    Bilateral Near:     Physical Exam Vitals and nursing note reviewed.  Constitutional:      General: He is not in acute distress.    Appearance: Normal appearance. He is not ill-appearing.  HENT:     Head: Normocephalic and atraumatic.  Cardiovascular:     Rate and Rhythm: Normal rate and regular rhythm.     Pulses: Normal pulses.     Heart sounds: Normal heart sounds. No murmur heard. No gallop.   Pulmonary:     Effort: Pulmonary effort is normal.     Breath sounds: Normal breath sounds. No wheezing, rhonchi or rales.  Abdominal:     General: Bowel sounds are normal.     Palpations: Abdomen is soft.     Tenderness: There is abdominal tenderness. There is no guarding or rebound.  Skin:    General: Skin  is warm and dry.     Capillary Refill: Capillary refill takes less than 2 seconds.     Findings: No erythema or rash.  Neurological:     General: No focal deficit present.     Mental Status: He is alert and oriented to person,  place, and time.  Psychiatric:        Mood and Affect: Mood normal.        Behavior: Behavior normal.        Thought Content: Thought content normal.        Judgment: Judgment normal.      UC Treatments / Results  Labs (all labs ordered are listed, but only abnormal results are displayed) Labs Reviewed - No data to display  EKG   Radiology No results found.  Procedures Procedures (including critical care time)  Medications Ordered in UC Medications - No data to display  Initial Impression / Assessment and Plan / UC Course  I have reviewed the triage vital signs and the nursing notes.  Pertinent labs & imaging results that were available during my care of the patient were reviewed by me and considered in my medical decision making (see chart for details).   Patient is a very pleasant, nontoxic-appearing 40 year old male here for evaluation of nausea, abdominal pain, and diarrhea that started while he was in [place].  For the first 2 days his diarrhea stools were all liquid and now they have started to solidify, he also describes them as "sickeningly sweet".  He has not been on any antibiotics recently.  He denies any blood in his stool.  He has had nausea but has not had any vomiting.  Patient is able to tolerate p.o. fluids and food.  Physical exam reveals a benign cardiopulmonary exam.  Abdomen is protuberant but soft with mild diffuse tenderness without focal findings, guarding, or rebound.  Bowel sounds are positive in all 4 quadrants.  Will send patient home with collection kit for GI pathogen panel, C. difficile, and O&P.  We will also give patient prescription for Zofran and dicyclomine as needed for nausea and abdominal cramping.   Final Clinical  Impressions(s) / UC Diagnoses   Final diagnoses:  Diarrhea, unspecified type     Discharge Instructions     Take the Zofran every 8 hours as needed for nausea.  Take the Bentyl every 6 hours as needed for abdominal cramping.  Continue to hydrate with fluids and continue your diet as tolerated.  Collect stool specimens, placed them in the proper containers, and return them to the lab here at the [facility].  Have any increase in your abdominal pain, you develop a fever, you develop vomiting and are unable to keep in fluids or food return for reevaluation or go to the ER.    ED Prescriptions    Medication Sig Dispense Auth. Provider   ondansetron (ZOFRAN ODT) 8 MG disintegrating tablet Take 1 tablet (8 mg total) by mouth every 8 (eight) hours as needed for nausea or vomiting. 20 tablet Margarette Canada, NP   dicyclomine (BENTYL) 20 MG tablet Take 1 tablet (20 mg total) by mouth 2 (two) times daily. 20 tablet Margarette Canada, NP     PDMP not reviewed this encounter.   Margarette Canada, NP 03/23/21 [phone]

## 2021-03-24 ENCOUNTER — Telehealth: Payer: Self-pay | Admitting: Emergency Medicine

## 2021-03-24 DIAGNOSIS — A09 Infectious gastroenteritis and colitis, unspecified: Secondary | ICD-10-CM

## 2021-03-24 LAB — GASTROINTESTINAL PANEL BY PCR, STOOL (REPLACES STOOL CULTURE)
Adenovirus F40/41: NOT DETECTED
Astrovirus: NOT DETECTED
Campylobacter species: NOT DETECTED
Cryptosporidium: NOT DETECTED
Cyclospora cayetanensis: NOT DETECTED
Entamoeba histolytica: NOT DETECTED
Enteroaggregative E coli (EAEC): DETECTED — AB
Enteropathogenic E coli (EPEC): DETECTED — AB
Enterotoxigenic E coli (ETEC): DETECTED — AB
Giardia lamblia: NOT DETECTED
Norovirus GI/GII: DETECTED — AB
Plesimonas shigelloides: NOT DETECTED
Rotavirus A: NOT DETECTED
Salmonella species: NOT DETECTED
Sapovirus (I, II, IV, and V): NOT DETECTED
Shiga like toxin producing E coli (STEC): NOT DETECTED
Shigella/Enteroinvasive E coli (EIEC): NOT DETECTED
Vibrio cholerae: NOT DETECTED
Vibrio species: DETECTED — AB
Yersinia enterocolitica: NOT DETECTED

## 2021-03-24 LAB — C DIFFICILE QUICK SCREEN W PCR REFLEX
C Diff antigen: NEGATIVE
C Diff interpretation: NOT DETECTED
C Diff toxin: NEGATIVE

## 2021-03-24 MED ORDER — AZITHROMYCIN 500 MG PO TABS: 1000.0000 mg | ORAL_TABLET | Freq: Once | ORAL | 0 refills | Status: AC

## 2021-03-24 NOTE — Telephone Encounter (Signed)
Patient stool PCR panel grew out vibrio species, enteroaggressive E. coli, enteropathogenic E. coli, enterotoxigenic E. coli, and norovirus.  Patient notified via phone after verifying identity through name and date of birth.  We will treat patient with a single gram of azithromycin and a one-time dose.  Patient advised to continue his oral rehydration using Pedialyte, water, broth, and continue advancing his diet as tolerated.  Patient also advised that if he has an increase in his symptoms, starts passing blood through stool, develops vomiting and cannot keep fluids or food down that he needs to return for reevaluation.  Patient verbalizes understanding of same.

## 2021-04-24 ENCOUNTER — Other Ambulatory Visit: Payer: Self-pay

## 2021-04-24 DIAGNOSIS — F908 Attention-deficit hyperactivity disorder, other type: Secondary | ICD-10-CM

## 2021-04-25 MED ORDER — METHYLPHENIDATE HCL ER (OSM) 36 MG PO TBCR: 36.0000 mg | EXTENDED_RELEASE_TABLET | Freq: Every day | ORAL | 0 refills | Status: DC

## 2021-04-25 NOTE — Telephone Encounter (Signed)
Last office visit 10/02/2020 for CPE.  Last refilled 01/22/2021 for #30 with no refills x 3 months.  No future appointments.

## 2021-08-13 ENCOUNTER — Other Ambulatory Visit: Payer: Self-pay

## 2021-08-13 DIAGNOSIS — F908 Attention-deficit hyperactivity disorder, other type: Secondary | ICD-10-CM

## 2021-08-13 NOTE — Telephone Encounter (Signed)
Refill request Concerta Last refill 06/24/21 Last office visit 10/02/20 Upcoming appointment 10/04/21 Last UDS 10/04/20

## 2021-08-14 MED ORDER — METHYLPHENIDATE HCL ER (OSM) 36 MG PO TBCR: 36.0000 mg | EXTENDED_RELEASE_TABLET | Freq: Every day | ORAL | 0 refills | Status: DC

## 2021-09-14 DIAGNOSIS — F908 Attention-deficit hyperactivity disorder, other type: Secondary | ICD-10-CM

## 2021-09-14 NOTE — Telephone Encounter (Signed)
Last office visit 10/02/2020 for CPE.  Last refilled 08/14/2021 for #30 with no refills x 3 months.  I think fill dates are incorrect.  CPE scheduled for 10/04/2021.

## 2021-09-17 MED ORDER — METHYLPHENIDATE HCL ER (OSM) 36 MG PO TBCR: 36.0000 mg | EXTENDED_RELEASE_TABLET | Freq: Every day | ORAL | 0 refills | Status: DC

## 2021-09-18 NOTE — Addendum Note (Signed)
Addended by: Damita Lack on: 09/18/2021 12:25 PM   Modules accepted: Orders

## 2021-09-19 MED ORDER — METHYLPHENIDATE HCL ER (OSM) 36 MG PO TBCR: 36.0000 mg | EXTENDED_RELEASE_TABLET | Freq: Every day | ORAL | 0 refills | Status: DC

## 2021-09-19 NOTE — Telephone Encounter (Signed)
Rx sent.  I can easily put in open orders for a [facility] draw, then he can go to any [facility] draw statin before his appointment.  Very easy to do - his preference.

## 2021-09-19 NOTE — Addendum Note (Signed)
Addended by: Damita Lack on: 09/19/2021 04:50 PM   Modules accepted: Orders

## 2021-09-20 MED ORDER — METHYLPHENIDATE HCL ER (OSM) 36 MG PO TBCR: 36.0000 mg | EXTENDED_RELEASE_TABLET | Freq: Every day | ORAL | 0 refills | Status: DC

## 2021-10-04 ENCOUNTER — Other Ambulatory Visit: Payer: Self-pay

## 2021-10-04 ENCOUNTER — Encounter: Payer: Self-pay | Admitting: Family Medicine

## 2021-10-04 ENCOUNTER — Ambulatory Visit (INDEPENDENT_AMBULATORY_CARE_PROVIDER_SITE_OTHER): Payer: Commercial Managed Care - PPO | Admitting: Family Medicine

## 2021-10-04 VITALS — BP 120/80 | HR 65 | Temp 98.0°F | Ht 72.0 in | Wt 258.1 lb

## 2021-10-04 DIAGNOSIS — Z131 Encounter for screening for diabetes mellitus: Secondary | ICD-10-CM | POA: Diagnosis not present

## 2021-10-04 DIAGNOSIS — Z0000 Encounter for general adult medical examination without abnormal findings: Secondary | ICD-10-CM | POA: Diagnosis not present

## 2021-10-04 DIAGNOSIS — Z1159 Encounter for screening for other viral diseases: Secondary | ICD-10-CM

## 2021-10-04 DIAGNOSIS — Z13220 Encounter for screening for lipoid disorders: Secondary | ICD-10-CM | POA: Diagnosis not present

## 2021-10-04 DIAGNOSIS — Z79899 Other long term (current) drug therapy: Secondary | ICD-10-CM | POA: Diagnosis not present

## 2021-10-04 LAB — HEPATIC FUNCTION PANEL
ALT: 16 U/L (ref 0–53)
AST: 23 U/L (ref 0–37)
Albumin: 4.5 g/dL (ref 3.5–5.2)
Alkaline Phosphatase: 39 U/L (ref 39–117)
Bilirubin, Direct: 0.1 mg/dL (ref 0.0–0.3)
Total Bilirubin: 0.6 mg/dL (ref 0.2–1.2)
Total Protein: 7.6 g/dL (ref 6.0–8.3)

## 2021-10-04 LAB — BASIC METABOLIC PANEL
BUN: 17 mg/dL (ref 6–23)
CO2: 30 mEq/L (ref 19–32)
Calcium: 9.6 mg/dL (ref 8.4–10.5)
Chloride: 104 mEq/L (ref 96–112)
Creatinine, Ser: 1.1 mg/dL (ref 0.40–1.50)
GFR: 84.69 mL/min (ref >60.00)
Glucose, Bld: 88 mg/dL (ref 70–99)
Potassium: 4.4 mEq/L (ref 3.5–5.1)
Sodium: 139 mEq/L (ref 135–145)

## 2021-10-04 LAB — CBC WITH DIFFERENTIAL/PLATELET
Basophils Absolute: 0 10*3/uL (ref 0.0–0.1)
Basophils Relative: 0.9 % (ref 0.0–3.0)
Eosinophils Absolute: 0.3 10*3/uL (ref 0.0–0.7)
Eosinophils Relative: 5.7 % — ABNORMAL HIGH (ref 0.0–5.0)
HCT: 43.9 % (ref 39.0–52.0)
Hemoglobin: 14.2 g/dL (ref 13.0–17.0)
Lymphocytes Relative: 22.9 % (ref 12.0–46.0)
Lymphs Abs: 1.1 10*3/uL (ref 0.7–4.0)
MCHC: 32.3 g/dL (ref 30.0–36.0)
MCV: 90.9 fl (ref 78.0–100.0)
Monocytes Absolute: 0.7 10*3/uL (ref 0.1–1.0)
Monocytes Relative: 16 % — ABNORMAL HIGH (ref 3.0–12.0)
Neutro Abs: 2.5 10*3/uL (ref 1.4–7.7)
Neutrophils Relative %: 54.5 % (ref 43.0–77.0)
Platelets: 234 10*3/uL (ref 150.0–400.0)
RBC: 4.83 Mil/uL (ref 4.22–5.81)
RDW: 14.1 % (ref 11.5–15.5)
WBC: 4.7 10*3/uL (ref 4.0–10.5)

## 2021-10-04 LAB — HEMOGLOBIN A1C: Hgb A1c MFr Bld: 5 % (ref 4.6–6.5)

## 2021-10-04 LAB — LIPID PANEL
Cholesterol: 200 mg/dL (ref 0–200)
HDL: 59.5 mg/dL (ref >39.00)
LDL Cholesterol: 126 mg/dL — ABNORMAL HIGH (ref 0–99)
NonHDL: 140.58
Total CHOL/HDL Ratio: 3
Triglycerides: 75 mg/dL (ref 0.0–149.0)
VLDL: 15 mg/dL (ref 0.0–40.0)

## 2021-10-04 MED ORDER — FLUCONAZOLE 150 MG PO TABS: ORAL_TABLET | ORAL | 0 refills | Status: DC

## 2021-10-04 NOTE — Progress Notes (Signed)
Nissa Stannard T. Piero Mustard, MD, CAQ Sports Medicine [facility] [address] [place], [address]  Phone: [phone]  FAX: [phone]  Tristan Mcgee - 40 y.o. male  MRN [id number]  Date of Birth: May 16, 1981  Date: 10/04/2021  PCP: Hannah Beat, MD  Referral: Hannah Beat, MD  Chief Complaint  Patient presents with   Annual Exam    This visit occurred during the SARS-CoV-2 public health emergency.  Safety protocols were in place, including screening questions prior to the visit, additional usage of staff PPE, and extensive cleaning of exam room while observing appropriate contact time as indicated for disinfecting solutions.   Patient Care Team: Hannah Beat, MD as PCP - General Subjective:   Tristan Mcgee is a 40 y.o. pleasant patient who presents with the following:  Preventative Health Maintenance Visit:  Health Maintenance Summary Reviewed and updated, unless pt declines services.  Tobacco History Reviewed. Alcohol: rare, social STD concerns: no risk or activity to increase risk Drug Use: None  Tinea Versicolor. Recurrent.  Pulse 50's sometimes.   Working 8-10 hours a week. He is doing intermittent fasting, 16-hour fast. While his wife has had cancer, he has been deviating from this a bit on the weekend.  Wife:  Thymus cancer.  Submandibular gland cancer.     Health Maintenance  Topic Date Due   Pneumococcal Vaccine 47-87 Years old (1 - PCV) Never done   Hepatitis C Screening  Never done   INFLUENZA VACCINE  06/04/2021   TETANUS/TDAP  05/16/2026   COVID-19 Vaccine  Completed   HIV Screening  Completed   HPV VACCINES  Aged Out   Immunization History  Administered Date(s) Administered   Influenza,inj,Quad PF,6+ Mos 07/31/2017, 08/19/2018   Influenza-Unspecified 08/26/2019, 08/23/2020   Moderna Covid-19 Vaccine Bivalent Booster 71yrs & up 09/30/2021   Moderna Sars-Covid-2 Vaccination 01/20/2020,  02/17/2020, 09/27/2020   Tdap 05/16/2016   Patient Active Problem List   Diagnosis Date Noted   Left varicocele 10/02/2020   Concussion without loss of consciousness 09/21/2015   H/O bariatric surgery (SLEEVE) 09/21/2015   Asthma 11/01/2010   Hypertension 10/17/2010   Attention deficit disorder 08/23/2009    Past Medical History:  Diagnosis Date   ADD (attention deficit disorder)    Allergic rhinitis due to pollen    Asthma    Concussion without loss of consciousness 09/21/2015   H/O bariatric surgery (SLEEVE) 09/21/2015   Hypertension 10/17/2010    Past Surgical History:  Procedure Laterality Date   CHOLECYSTECTOMY     SLEEVE GASTROPLASTY  2015    Family History  Problem Relation Age of Onset   Hypertension Father    Cancer Maternal Grandmother    Heart disease Maternal Grandfather    Colon cancer Paternal Grandmother    Heart disease Paternal Grandfather     Past Medical History, Surgical History, Social History, Family History, Problem List, Medications, and Allergies have been reviewed and updated if relevant.  Review of Systems: Pertinent positives are listed above.  Otherwise, a full 14 point review of systems has been done in full and it is negative except where it is noted positive.  Objective:   BP 120/80   Pulse 65   Temp 98 F (36.7 C) (Temporal)   Ht 6' (1.829 m)   Wt 258 lb 2 oz (117.1 kg)   SpO2 98%   BMI 35.01 kg/m  Ideal Body Weight: Weight in (lb) to have BMI = 25: 183.9  Ideal  Body Weight: Weight in (lb) to have BMI = 25: 183.9 No results found. Depression screen [facility] 2/9 10/04/2021 10/02/2020 04/20/2020 09/13/2019 08/19/2018  Decreased Interest 0 0 0 0 0  Down, Depressed, Hopeless 0 0 0 0 0  PHQ - 2 Score 0 0 0 0 0  Altered sleeping - - 1 - -  Tired, decreased energy - - 1 - -  Change in appetite - - 1 - -  Feeling bad or failure about yourself  - - 0 - -  Trouble concentrating - - 2 - -  Moving slowly or fidgety/restless - - 0 - -   Suicidal thoughts - - 0 - -  PHQ-9 Score - - 5 - -  Difficult doing work/chores - - Somewhat difficult - -     GEN: well developed, well nourished, no acute distress Eyes: conjunctiva and lids normal, PERRLA, EOMI ENT: TM clear, nares clear, oral exam WNL Neck: supple, no lymphadenopathy, no thyromegaly, no JVD Pulm: clear to auscultation and percussion, respiratory effort normal CV: regular rate and rhythm, S1-S2, no murmur, rub or gallop, no bruits, peripheral pulses normal and symmetric, no cyanosis, clubbing, edema or varicosities GI: soft, non-tender; no hepatosplenomegaly, masses; active bowel sounds all quadrants GU: deferred Lymph: no cervical, axillary or inguinal adenopathy MSK: gait normal, muscle tone and strength WNL, no joint swelling, effusions, discoloration, crepitus  SKIN: clear, good turgor, color WNL, no rashes, lesions, or ulcerations Neuro: normal mental status, normal strength, sensation, and motion Psych: alert; oriented to person, place and time, normally interactive and not anxious or depressed in appearance.  All labs reviewed with patient.   Assessment and Plan:     ICD-10-CM   1. Healthcare maintenance  Z00.00     2. Screening, lipid  Z13.220 Lipid panel    3. Screening for diabetes mellitus  Z13.1 Basic metabolic panel    Hemoglobin A1c    4. Encounter for long-term (current) use of medications  Z79.899 CBC with Differential/Platelet    Hepatic function panel    5. Need for hepatitis C screening test  Z11.59 Hepatitis C antibody     Is basically doing well.  His diet with fasting is going well, and he is working out 8 to 10 hours a week.  I congratulated him.  I tried to counsel him the best I could about his wife.  Other than this, I think we should check some basic labs, and otherwise continue with his general fitness and diet that he is already doing.  Health Maintenance Exam: The patient's preventative maintenance and recommended  screening tests for an annual wellness exam were reviewed in full today. Brought up to date unless services declined.  Counselled on the importance of diet, exercise, and its role in overall health and mortality. The patient's FH and SH was reviewed, including their home life, tobacco status, and drug and alcohol status.  Follow-up in 1 year for physical exam or additional follow-up below.  Follow-up: No follow-ups on file. Or follow-up in 1 year if not noted.  No orders of the defined types were placed in this encounter.  Medications Discontinued During This Encounter  Medication Reason   dicyclomine (BENTYL) 20 MG tablet Completed Course   omeprazole (PRILOSEC) 20 MG capsule Completed Course   ondansetron (ZOFRAN ODT) 8 MG disintegrating tablet Completed Course   Orders Placed This Encounter  Procedures   Basic metabolic panel   CBC with Differential/Platelet   Hepatic function panel   Hemoglobin A1c  Lipid panel   Hepatitis C antibody    Signed,  Reynoldo Mainer T. Zenab Gronewold, MD   Allergies as of 10/04/2021       Reactions   Bactrim [sulfamethoxazole-trimethoprim] Other (See Comments)   Joint & Muscle Pain   Clindamycin/lincomycin Other (See Comments)   C-Diff Infection        Medication List        Accurate as of October 04, 2021  9:10 AM. If you have any questions, ask your nurse or doctor.          STOP taking these medications    dicyclomine 20 MG tablet Commonly known as: BENTYL Stopped by: Hannah Beat, MD   omeprazole 20 MG capsule Commonly known as: PRILOSEC Stopped by: Hannah Beat, MD   ondansetron 8 MG disintegrating tablet Commonly known as: Zofran ODT Stopped by: Hannah Beat, MD       TAKE these medications    albuterol 108 (90 Base) MCG/ACT inhaler Commonly known as: VENTOLIN HFA Inhale 2 puffs into the lungs every 6 (six) hours as needed.   EPINEPHrine 0.3 mg/0.3 mL Soaj injection Commonly known as: EPI-PEN Inject into  the muscle.   FLUoxetine 20 MG capsule Commonly known as: PROZAC TAKE 1 CAPSULE BY MOUTH EVERY DAY   fluticasone 50 MCG/ACT nasal spray Commonly known as: FLONASE SMARTSIG:2 Puff(s) Both Nares Daily   Loratadine 10 MG Caps Take 1 capsule by mouth daily.   methylphenidate 36 MG CR tablet Commonly known as: Concerta Take 1 tablet (36 mg total) by mouth daily.   methylphenidate 36 MG CR tablet Commonly known as: Concerta Take 1 tablet (36 mg total) by mouth daily.   methylphenidate 36 MG CR tablet Commonly known as: Concerta Take 1 tablet (36 mg total) by mouth daily.   montelukast 10 MG tablet Commonly known as: SINGULAIR Take 10 mg by mouth at bedtime.   pantoprazole 40 MG tablet Commonly known as: PROTONIX Take 40 mg by mouth every morning.   Spacer/Aero-Holding [employer] Use while using albuterol, 2 puffs prn q 8 hours

## 2021-10-05 LAB — HEPATITIS C ANTIBODY
Hepatitis C Ab: NONREACTIVE
SIGNAL TO CUT-OFF: <0.02 (ref <1.00)

## 2021-10-09 ENCOUNTER — Other Ambulatory Visit: Payer: Self-pay | Admitting: Family Medicine

## 2021-11-17 IMAGING — CT CT CHEST W/ CM
2 of 4 series · 15 of 36 positions shown, 18 images · IV contrast (omnipaque)
Comparison: None.

CLINICAL DATA: Left upper chest, shoulder pain

EXAM:
CT CHEST WITH CONTRAST
TECHNIQUE: Multidetector CT imaging of the chest was performed during
intravenous contrast administration.
CONTRAST:  75mL OMNIPAQUE IOHEXOL 300 MG/ML  SOLN

[Series 2: axial chest 2.00 · axial · 0.92mm/px · z∈[-1371,-1037]mm · 12 of 199 slices shown, 15 images]
[im 16/199  mediastinal]
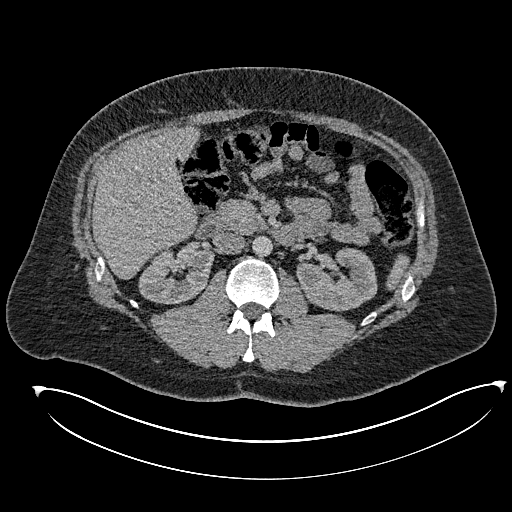
[im 16/199  lung]
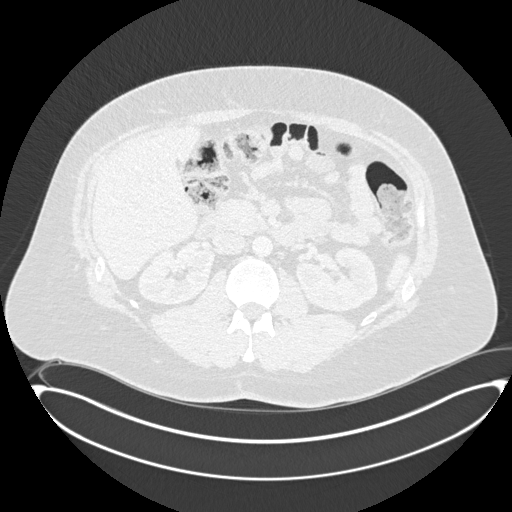
[im 31/199  lung]
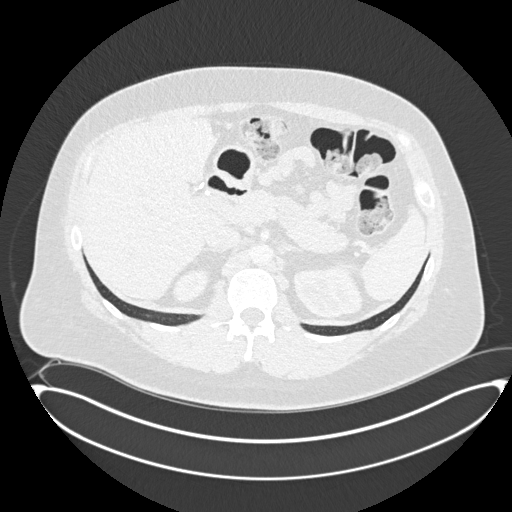
[im 46/199  lung]
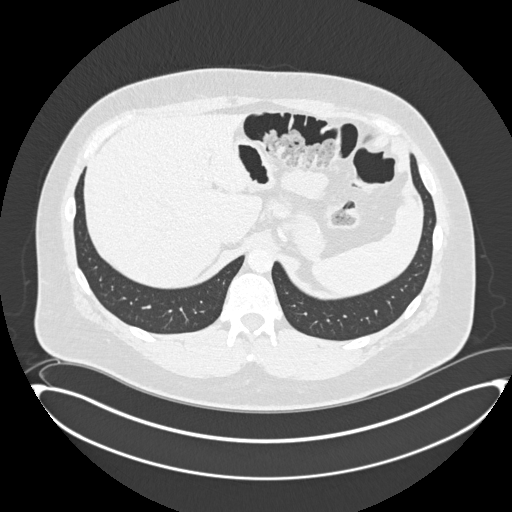
[im 61/199  lung]
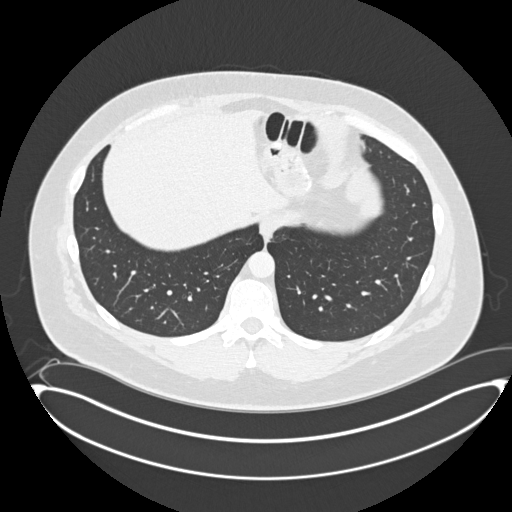
[im 77/199  mediastinal]
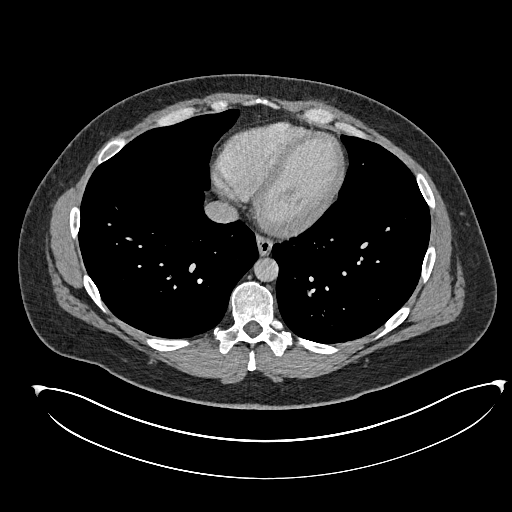
[im 77/199  lung]
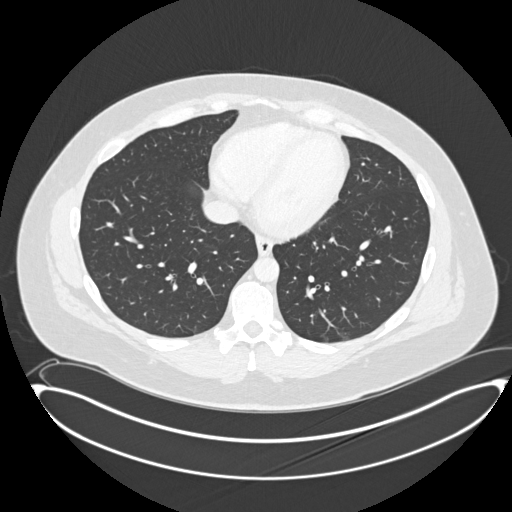
[im 92/199  lung]
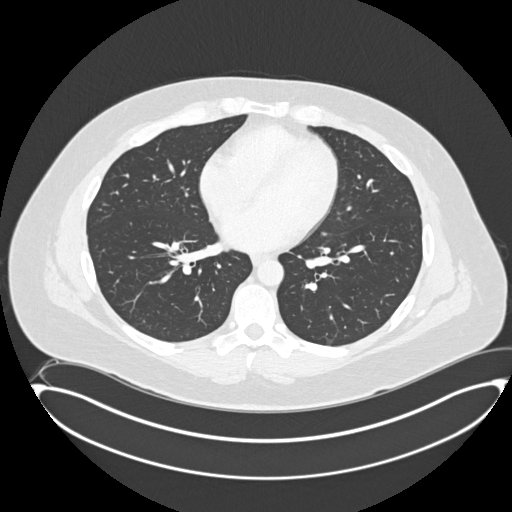
[im 107/199  lung]
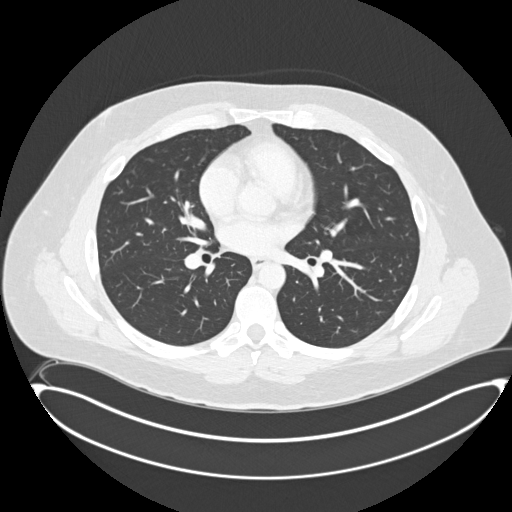
[im 122/199  lung]
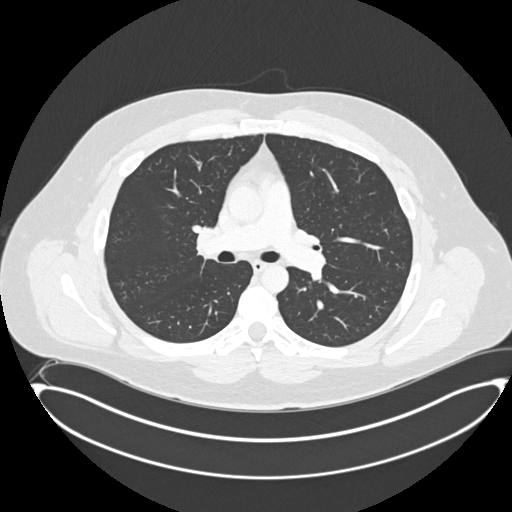
[im 138/199  mediastinal]
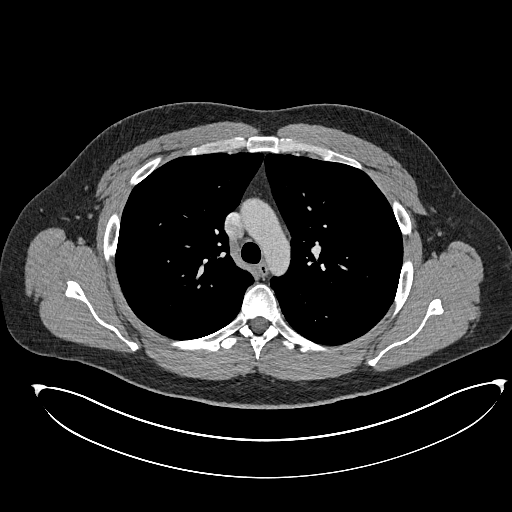
[im 138/199  lung]
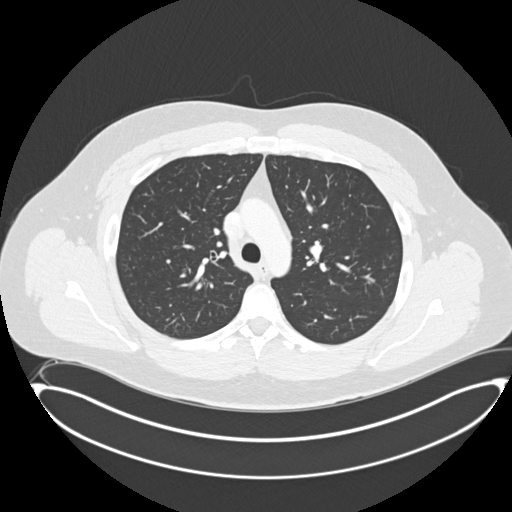
[im 153/199  lung]
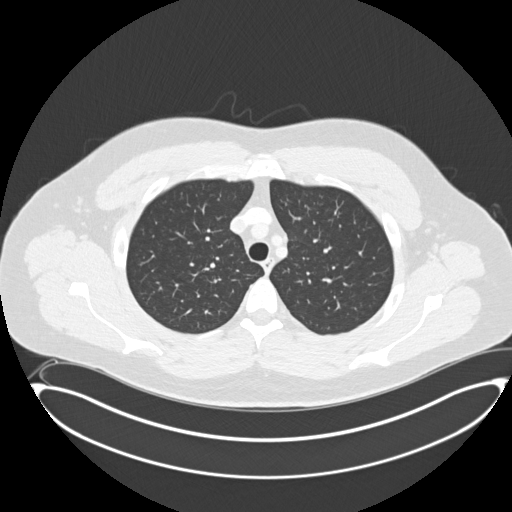
[im 168/199  lung]
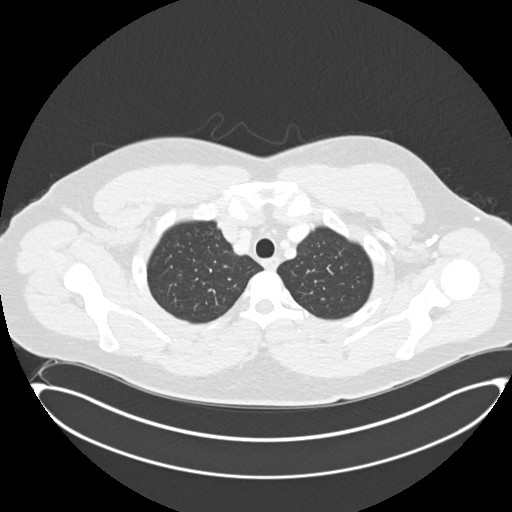
[im 183/199  lung]
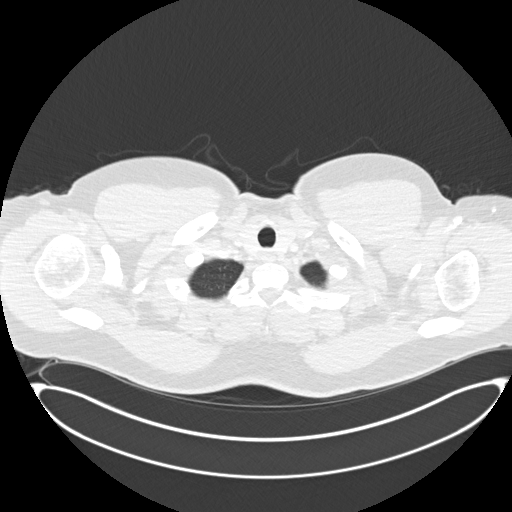

[Series 4: coronal chest 2.00 cor · coronal · 0.78mm/px · 3 of 201 slices shown]
[im 41/201  lung]
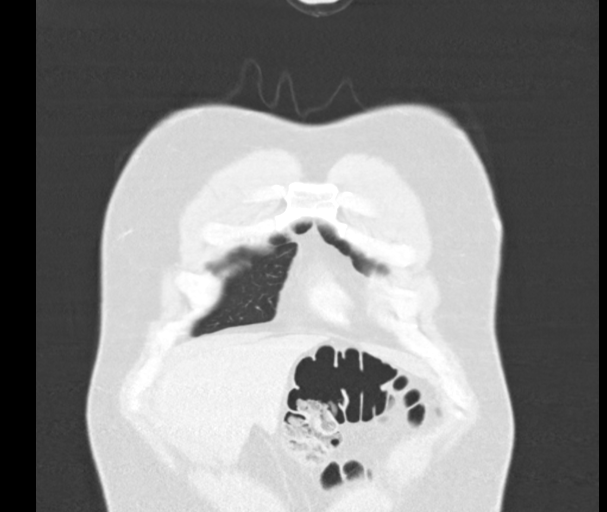
[im 81/201  lung]
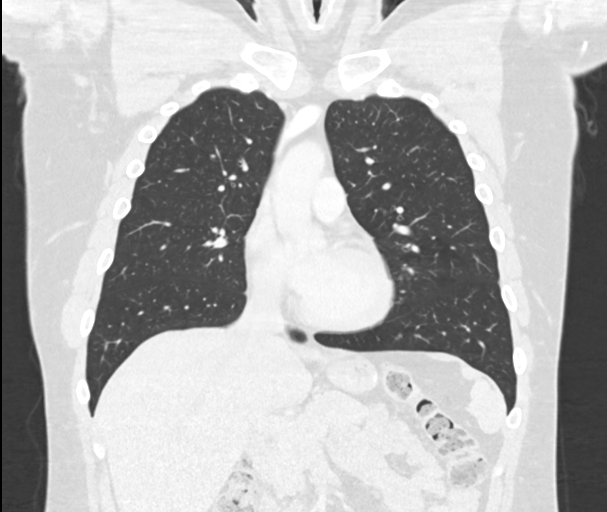
[im 121/201  lung]
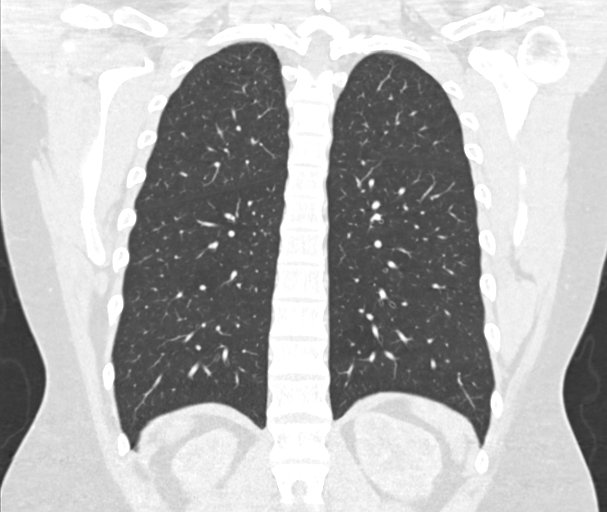

[15 of 36 positions shown; findings below may reference images not displayed]

FINDINGS: Cardiovascular: Heart is normal size. Aorta is normal caliber.

Mediastinum/Nodes: No mediastinal, hilar, or axillary adenopathy.
Trachea and esophagus are unremarkable. Thyroid unremarkable.

Lungs/Pleura: Lungs are clear. No focal airspace opacities or
suspicious nodules. No effusions.

Upper Abdomen: Imaging into the upper abdomen shows no acute
findings. Postoperative changes from prior gastric sleeve.

Musculoskeletal: Chest wall soft tissues are unremarkable. No acute
bony abnormality.
IMPRESSION: Normal study.

## 2021-11-19 ENCOUNTER — Other Ambulatory Visit: Payer: Self-pay | Admitting: Family Medicine

## 2021-11-19 DIAGNOSIS — F908 Attention-deficit hyperactivity disorder, other type: Secondary | ICD-10-CM

## 2021-11-19 MED ORDER — METHYLPHENIDATE HCL ER (OSM) 36 MG PO TBCR: 36.0000 mg | EXTENDED_RELEASE_TABLET | Freq: Every day | ORAL | 0 refills | Status: DC

## 2021-11-19 NOTE — Telephone Encounter (Signed)
Last office visit 10/04/2021 for CPE.  Last refilled 09/20/2021 for #30 with no refills x 2 months.  No future appointments.

## 2022-02-17 ENCOUNTER — Other Ambulatory Visit: Payer: Self-pay | Admitting: Family Medicine

## 2022-02-17 DIAGNOSIS — F908 Attention-deficit hyperactivity disorder, other type: Secondary | ICD-10-CM

## 2022-02-17 NOTE — Telephone Encounter (Signed)
Last office visit 10/04/21 for CPE.  Last refilled 11/19/21 for #30 with no refills x 3 months.  UDS/Contract 10/02/2020.    No future appointments.  ?

## 2022-02-18 MED ORDER — METHYLPHENIDATE HCL ER (OSM) 36 MG PO TBCR: 36.0000 mg | EXTENDED_RELEASE_TABLET | Freq: Every day | ORAL | 0 refills | Status: DC

## 2022-04-11 ENCOUNTER — Other Ambulatory Visit: Payer: Self-pay | Admitting: Family Medicine

## 2022-05-23 ENCOUNTER — Other Ambulatory Visit: Payer: Self-pay | Admitting: Family Medicine

## 2022-05-23 DIAGNOSIS — F908 Attention-deficit hyperactivity disorder, other type: Secondary | ICD-10-CM

## 2022-05-23 MED ORDER — METHYLPHENIDATE HCL ER (OSM) 36 MG PO TBCR: 36.0000 mg | EXTENDED_RELEASE_TABLET | Freq: Every day | ORAL | 0 refills | Status: DC

## 2022-05-23 NOTE — Telephone Encounter (Signed)
Last office visit 10/04/2021 for CPE.  Last refilled 02/18/2022 for #30 with no refills x 3 months.  No future appointments.  Patient is requesting 90 day supply.

## 2022-06-26 ENCOUNTER — Other Ambulatory Visit: Payer: Self-pay | Admitting: Family Medicine

## 2022-06-26 MED ORDER — FLUCONAZOLE 150 MG PO TABS: ORAL_TABLET | ORAL | 0 refills | Status: DC

## 2022-07-05 DIAGNOSIS — E7800 Pure hypercholesterolemia, unspecified: Secondary | ICD-10-CM | POA: Insufficient documentation

## 2022-07-10 ENCOUNTER — Other Ambulatory Visit: Payer: Self-pay | Admitting: Family Medicine

## 2022-08-08 ENCOUNTER — Encounter: Payer: Self-pay | Admitting: Family Medicine

## 2022-08-12 ENCOUNTER — Encounter: Payer: Self-pay | Admitting: Family Medicine

## 2022-08-12 ENCOUNTER — Ambulatory Visit: Payer: Commercial Managed Care - PPO | Admitting: Family Medicine

## 2022-08-12 VITALS — BP 120/84 | HR 69 | Temp 98.9°F | Ht 72.0 in | Wt 311.5 lb

## 2022-08-12 DIAGNOSIS — E782 Mixed hyperlipidemia: Secondary | ICD-10-CM

## 2022-08-12 DIAGNOSIS — Z23 Encounter for immunization: Secondary | ICD-10-CM | POA: Diagnosis not present

## 2022-08-12 DIAGNOSIS — Z7185 Encounter for immunization safety counseling: Secondary | ICD-10-CM

## 2022-08-12 DIAGNOSIS — Z7184 Encounter for health counseling related to travel: Secondary | ICD-10-CM | POA: Diagnosis not present

## 2022-08-12 MED ORDER — ATOVAQUONE-PROGUANIL HCL 250-100 MG PO TABS: 1.0000 | ORAL_TABLET | Freq: Every day | ORAL | 0 refills | Status: DC

## 2022-08-12 MED ORDER — TYPHOID VACCINE PO CPDR: 1.0000 | DELAYED_RELEASE_CAPSULE | ORAL | 0 refills | Status: DC

## 2022-08-12 MED ORDER — ROSUVASTATIN CALCIUM 20 MG PO TABS: 20.0000 mg | ORAL_TABLET | Freq: Every day | ORAL | 3 refills | Status: DC

## 2022-08-12 NOTE — Progress Notes (Signed)
[name] [name], MD, CAQ Sports Medicine [facility] at [facility] [address] [place], [address]  Phone: [phone]  FAX: [phone]  Tristan Mcgee - 41 y.o. male  MRN [id number]  Date of Birth: Nov 24, 1980  Date: 08/12/2022  PCP: Hannah Beat, MD  Referral: Hannah Beat, MD  Chief Complaint  Patient presents with   Hyperlipidemia    Discuss Cholesterol Med   Travel Consult    Going to [place] 09/04/22   Subjective:   Tristan Mcgee is a 41 y.o. very pleasant male patient with Body mass index is 42.25 kg/m. who presents with the following:  [name] is a well-known patient, he presents today after follow-up on a MyChart message where he is traveling to [place] and requires information about vaccines.  He also recently had his cholesterol checked for work and it was greater than 265 - and 189 LDL.  He does have a history of coronary disease in the family, and this is significantly worse in any other cholesterol levels that he has had in the past.  He does note that he has been eating not quite as well, and he has gained some weight.  Wt Readings from Last 3 Encounters:  08/12/22 (!) 311 lb 8 oz (141.3 kg)  10/04/21 258 lb 2 oz (117.1 kg)  03/23/21 265 lb (120.2 kg)    25 pounds above baseline weight.  50 pounds from last OV.   MGF and PGF have d/c from CAD Father with hyperlipidemia.   He also has an upcoming trip coming to [place], and he is here to discuss vaccination and travel medicine guidelines.  Typhoid vaccine - vivotif Hep A Malarone for malaria    Immunization History  Administered Date(s) Administered   Influenza,inj,Quad PF,6+ Mos 07/31/2017, 08/19/2018, 09/30/2021, 08/12/2022   Influenza-Unspecified 08/26/2019, 08/23/2020   Moderna Covid-19 Vaccine Bivalent Booster 76yrs & up 09/30/2021   Moderna Sars-Covid-2 Vaccination 01/20/2020, 02/17/2020, 09/27/2020   Tdap 05/16/2016     Review of Systems is noted in the HPI,  as appropriate  Objective:   BP 120/84   Pulse 69   Temp 98.9 F (37.2 C) (Oral)   Ht 6' (1.829 m)   Wt (!) 311 lb 8 oz (141.3 kg)   SpO2 98%   BMI 42.25 kg/m   GEN: No acute distress; alert,appropriate. PULM: Breathing comfortably in no respiratory distress PSYCH: Normally interactive.  CV: RRR, no m/g/r   Laboratory and Imaging Data:  Assessment and Plan:     ICD-10-CM   1. Mixed hyperlipidemia  E78.2     2. Travel advice encounter  Z71.84     3. Vaccine counseling  Z71.85     4. Need for influenza vaccination  Z23 Flu Vaccine QUAD 6+ mos PF IM (Fluarix Quad PF)    5. Need for hepatitis A vaccination  Z23 Hepatitis A vaccine adult IM     New elevated cholesterol, now with a total cholesterol 165 and LDL 189.  Since decided increase, and he is now 312 pounds.  He has gained a little bit over 50 pounds since his last office visit.  Initiate Crestor 20 mg.  He will follow-up in the few months for his general health maintenance exam, and at that point we can recheck his cholesterol.  For travel to [place], we will give him typhoid vaccine as well as hepatitis A.  I am also going to give him malaria prophylaxis.  Medication Management during today's office visit:  Meds ordered this encounter  Medications   rosuvastatin (CRESTOR) 20 MG tablet    Sig: Take 1 tablet (20 mg total) by mouth daily.    Dispense:  90 tablet    Refill:  3   typhoid (VIVOTIF) DR capsule    Sig: Take 1 capsule by mouth every other day.    Dispense:  4 capsule    Refill:  0   atovaquone-proguanil (MALARONE) 250-100 MG TABS tablet    Sig: Take 1 tablet by mouth daily. Start 2 days before trip    Dispense:  20 tablet    Refill:  0   Medications Discontinued During This Encounter  Medication Reason   fluconazole (DIFLUCAN) 150 MG tablet Completed Course    Orders placed today for conditions managed today: Orders Placed This Encounter  Procedures   Flu Vaccine QUAD 6+ mos PF IM (Fluarix Quad  PF)   Hepatitis A vaccine adult IM    Disposition: No follow-ups on file.  [employer] One speech-to-text software was used for transcription in this dictation.  Possible transcriptional errors can occur using Editor, commissioning.   Signed,  Maud Deed. Arturo Sofranko, MD   Outpatient Encounter Medications as of 08/12/2022  Medication Sig   albuterol (PROVENTIL HFA;VENTOLIN HFA) 108 (90 BASE) MCG/ACT inhaler Inhale 2 puffs into the lungs every 6 (six) hours as needed.   atovaquone-proguanil (MALARONE) 250-100 MG TABS tablet Take 1 tablet by mouth daily. Start 2 days before trip   EPINEPHrine 0.3 mg/0.3 mL IJ SOAJ injection Inject into the muscle.   FLUoxetine (PROZAC) 20 MG capsule TAKE 1 CAPSULE BY MOUTH ONCE DAILY   fluticasone (FLONASE) 50 MCG/ACT nasal spray SMARTSIG:2 Puff(s) Both Nares Daily   Loratadine 10 MG CAPS Take 1 capsule by mouth daily.   methylphenidate (CONCERTA) 36 MG PO CR tablet Take 1 tablet (36 mg total) by mouth daily.   methylphenidate (CONCERTA) 36 MG PO CR tablet Take 1 tablet (36 mg total) by mouth daily.   methylphenidate (CONCERTA) 36 MG PO CR tablet Take 1 tablet (36 mg total) by mouth daily.   montelukast (SINGULAIR) 10 MG tablet Take 10 mg by mouth at bedtime.   pantoprazole (PROTONIX) 40 MG tablet Take 40 mg by mouth every morning.   rosuvastatin (CRESTOR) 20 MG tablet Take 1 tablet (20 mg total) by mouth daily.   Spacer/Aero-Holding [employer] Use while using albuterol, 2 puffs prn q 8 hours   typhoid (VIVOTIF) DR capsule Take 1 capsule by mouth every other day.   [DISCONTINUED] cetirizine (ZYRTEC) 10 MG tablet Take 10 mg by mouth daily.   [DISCONTINUED] fluconazole (DIFLUCAN) 150 MG tablet Take 1 tab po today, then repeat in 7 days   No facility-administered encounter medications on file as of 08/12/2022.

## 2022-08-15 ENCOUNTER — Other Ambulatory Visit: Payer: Self-pay | Admitting: Family Medicine

## 2022-08-15 DIAGNOSIS — F908 Attention-deficit hyperactivity disorder, other type: Secondary | ICD-10-CM

## 2022-08-15 NOTE — Telephone Encounter (Signed)
Last office visit 08/12/22 Hyperlipidemia and Travel Consult.  Last refilled 05/23/22 for #90 with no refills.  No future appointments.

## 2022-08-16 MED ORDER — METHYLPHENIDATE HCL ER (OSM) 36 MG PO TBCR: 36.0000 mg | EXTENDED_RELEASE_TABLET | Freq: Every day | ORAL | 0 refills | Status: DC

## 2022-08-31 ENCOUNTER — Encounter: Payer: Self-pay | Admitting: Family Medicine

## 2022-09-02 MED ORDER — FLUCONAZOLE 150 MG PO TABS: ORAL_TABLET | ORAL | 0 refills | Status: DC

## 2022-09-19 ENCOUNTER — Encounter: Payer: Self-pay | Admitting: Family Medicine

## 2022-10-24 ENCOUNTER — Encounter: Payer: Self-pay | Admitting: Family Medicine

## 2022-10-25 ENCOUNTER — Other Ambulatory Visit: Payer: Self-pay | Admitting: Family Medicine

## 2022-10-25 ENCOUNTER — Other Ambulatory Visit (HOSPITAL_COMMUNITY): Payer: Self-pay

## 2022-10-25 MED ORDER — ZEPBOUND 7.5 MG/0.5ML ~~LOC~~ SOAJ: 7.5000 mg | SUBCUTANEOUS | 0 refills | Status: DC

## 2022-10-25 MED ORDER — ZEPBOUND 5 MG/0.5ML ~~LOC~~ SOAJ: 5.0000 mg | SUBCUTANEOUS | 0 refills | Status: DC

## 2022-10-25 MED ORDER — ZEPBOUND 2.5 MG/0.5ML ~~LOC~~ SOAJ: 2.5000 mg | SUBCUTANEOUS | 0 refills | Status: DC

## 2022-10-28 ENCOUNTER — Other Ambulatory Visit (HOSPITAL_COMMUNITY): Payer: Self-pay

## 2022-10-29 ENCOUNTER — Other Ambulatory Visit (HOSPITAL_COMMUNITY): Payer: Self-pay

## 2022-10-31 ENCOUNTER — Encounter: Payer: Self-pay | Admitting: Family Medicine

## 2022-11-06 LAB — LIPID PANEL
Cholesterol: 177 (ref 0–200)
HDL: 65 (ref 35–70)
LDL Cholesterol: 91
Triglycerides: 122 (ref 40–160)

## 2022-11-06 LAB — BASIC METABOLIC PANEL
BUN: 14 (ref 4–21)
CO2: 22 (ref 13–22)
Chloride: 103 (ref 99–108)
Creatinine: 1.2 (ref 0.6–1.3)
Glucose: 85
Potassium: 4.3 mEq/L (ref 3.5–5.1)
Sodium: 140 (ref 137–147)

## 2022-11-06 LAB — HEPATIC FUNCTION PANEL
ALT: 21 U/L (ref 10–40)
AST: 23 (ref 14–40)
Alkaline Phosphatase: 71 (ref 25–125)
Bilirubin, Total: 0.4

## 2022-11-06 LAB — VITAMIN B12: Vitamin B-12: 1188

## 2022-11-06 LAB — COMPREHENSIVE METABOLIC PANEL
Albumin: 4.4 (ref 3.5–5.0)
Calcium: 9.5 (ref 8.7–10.7)
Globulin: 2.6
eGFR: 79

## 2022-11-06 LAB — TSH: TSH: 2.2 (ref 0.41–5.90)

## 2022-11-06 LAB — CBC AND DIFFERENTIAL
HCT: 41 (ref 41–53)
Hemoglobin: 13.9 (ref 13.5–17.5)
Neutrophils Absolute: 2.9
Platelets: 282 10*3/uL (ref 150–400)
WBC: 6.3

## 2022-11-06 LAB — IRON,TIBC AND FERRITIN PANEL
%SAT: 26
Ferritin: 91
Iron: 86
TIBC: 330
UIBC: 244

## 2022-11-06 LAB — CBC: RBC: 4.72 (ref 3.87–5.11)

## 2022-11-06 LAB — HEMOGLOBIN A1C: Hemoglobin A1C: 5.1

## 2022-11-06 NOTE — Telephone Encounter (Signed)
Is a PA still needed? Per pt:

## 2022-11-07 NOTE — Telephone Encounter (Signed)
No - he wants to hold off on it for now.

## 2022-11-08 ENCOUNTER — Other Ambulatory Visit: Payer: Commercial Managed Care - PPO

## 2022-11-13 ENCOUNTER — Ambulatory Visit (INDEPENDENT_AMBULATORY_CARE_PROVIDER_SITE_OTHER): Payer: 59 | Admitting: Family Medicine

## 2022-11-13 VITALS — BP 120/80 | HR 72 | Temp 98.5°F | Ht 72.0 in | Wt 325.4 lb

## 2022-11-13 DIAGNOSIS — F908 Attention-deficit hyperactivity disorder, other type: Secondary | ICD-10-CM | POA: Diagnosis not present

## 2022-11-13 DIAGNOSIS — Z0000 Encounter for general adult medical examination without abnormal findings: Secondary | ICD-10-CM | POA: Diagnosis not present

## 2022-11-13 DIAGNOSIS — E782 Mixed hyperlipidemia: Secondary | ICD-10-CM | POA: Diagnosis not present

## 2022-11-13 MED ORDER — METHYLPHENIDATE HCL ER (OSM) 36 MG PO TBCR: 36.0000 mg | EXTENDED_RELEASE_TABLET | Freq: Every day | ORAL | 0 refills | Status: DC

## 2022-11-13 MED ORDER — FLUCONAZOLE 150 MG PO TABS: ORAL_TABLET | ORAL | 0 refills | Status: DC
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
## 2022-11-14 ENCOUNTER — Encounter: Payer: Self-pay | Admitting: Family Medicine

## 2023-01-24 ENCOUNTER — Other Ambulatory Visit: Payer: Self-pay | Admitting: Family Medicine

## 2023-03-07 ENCOUNTER — Other Ambulatory Visit: Payer: Self-pay | Admitting: Family Medicine

## 2023-03-07 DIAGNOSIS — F908 Attention-deficit hyperactivity disorder, other type: Secondary | ICD-10-CM

## 2023-03-07 MED ORDER — METHYLPHENIDATE HCL ER (OSM) 36 MG PO TBCR: 36.0000 mg | EXTENDED_RELEASE_TABLET | Freq: Every day | ORAL | 0 refills | Status: DC

## 2023-03-07 NOTE — Telephone Encounter (Signed)
Name of Medication: Concerta Name of Pharmacy: [facility] [address] Last [place] Ahr or Written Date and Quantity: 11/13/22, #90 Last Office Visit and Type: 11/13/22, CPE Next Office Visit and Type: none Last Controlled Substance Agreement Date: 09/13/19 Last UDS: 09/07/19

## 2023-03-13 ENCOUNTER — Other Ambulatory Visit: Payer: Self-pay | Admitting: Family Medicine

## 2023-03-13 DIAGNOSIS — F908 Attention-deficit hyperactivity disorder, other type: Secondary | ICD-10-CM

## 2023-03-13 MED ORDER — METHYLPHENIDATE HCL ER (OSM) 36 MG PO TBCR: 36.0000 mg | EXTENDED_RELEASE_TABLET | Freq: Every day | ORAL | 0 refills | Status: DC

## 2023-06-11 ENCOUNTER — Other Ambulatory Visit: Payer: Self-pay | Admitting: Family Medicine

## 2023-06-11 DIAGNOSIS — F908 Attention-deficit hyperactivity disorder, other type: Secondary | ICD-10-CM

## 2023-06-11 MED ORDER — METHYLPHENIDATE HCL ER (OSM) 36 MG PO TBCR: 36.0000 mg | EXTENDED_RELEASE_TABLET | Freq: Every day | ORAL | 0 refills | Status: DC

## 2023-06-11 NOTE — Telephone Encounter (Signed)
Last office visit 11/13/2022 for CPE.  Last refilled 03/13/2023 for #90 with no refills.  Next Appt: No future appointments.

## 2023-06-27 ENCOUNTER — Encounter: Payer: Self-pay | Admitting: Family Medicine

## 2023-07-02 MED ORDER — NONFORMULARY OR COMPOUNDED ITEM: 0 refills | Status: DC

## 2023-07-02 MED ORDER — NONFORMULARY OR COMPOUNDED ITEM: 3 refills | Status: DC

## 2023-07-02 NOTE — Telephone Encounter (Signed)
Can you fax to   [facility] [address] [place], [place] [address] Phone & Fax [phone] Phone [phone] Fax

## 2023-07-02 NOTE — Telephone Encounter (Signed)
Rx faxed to [phone] as requested.

## 2023-07-26 ENCOUNTER — Encounter: Payer: Self-pay | Admitting: Family Medicine

## 2023-07-26 DIAGNOSIS — E7800 Pure hypercholesterolemia, unspecified: Secondary | ICD-10-CM

## 2023-07-28 MED ORDER — PANTOPRAZOLE SODIUM 40 MG PO TBEC: 40.0000 mg | DELAYED_RELEASE_TABLET | Freq: Every morning | ORAL | 0 refills | Status: DC

## 2023-07-28 MED ORDER — FLUOXETINE HCL 20 MG PO CAPS: 20.0000 mg | ORAL_CAPSULE | Freq: Every day | ORAL | 0 refills | Status: DC

## 2023-07-28 MED ORDER — FLUCONAZOLE 150 MG PO TABS: ORAL_TABLET | ORAL | 0 refills | Status: DC

## 2023-07-28 NOTE — Telephone Encounter (Signed)
And if it is OK with Dr Patsy Lager I am having a flare up of my tinea versicolor and could actually likely use a refill on Flucanazole as well.  Last refilled 11/13/2022 for #4 with no refills.  No future appointments.

## 2023-08-04 MED ORDER — ROSUVASTATIN CALCIUM 20 MG PO TABS: 20.0000 mg | ORAL_TABLET | Freq: Every day | ORAL | 0 refills | Status: DC

## 2023-08-04 NOTE — Addendum Note (Signed)
Addended by: Nanci Pina on: 08/04/2023 09:39 AM   Modules accepted: Orders

## 2023-08-04 NOTE — Telephone Encounter (Signed)
E-scribed 90-day rosuvastatin refill to [facility]

## 2023-09-12 ENCOUNTER — Other Ambulatory Visit: Payer: Self-pay | Admitting: Family Medicine

## 2023-09-12 DIAGNOSIS — F900 Attention-deficit hyperactivity disorder, predominantly inattentive type: Secondary | ICD-10-CM

## 2023-09-12 DIAGNOSIS — F908 Attention-deficit hyperactivity disorder, other type: Secondary | ICD-10-CM

## 2023-09-12 NOTE — Telephone Encounter (Signed)
Last OV: 11/13/2022 Pending OV: 09/18/2023 Medication: Methylphenidate 36mg  Directions: Take one tablet daily Last Refill: 06/11/2023 Qty: #90 with 0 refills

## 2023-09-14 MED ORDER — METHYLPHENIDATE HCL ER (OSM) 36 MG PO TBCR: 36.0000 mg | EXTENDED_RELEASE_TABLET | Freq: Every day | ORAL | 0 refills | Status: DC

## 2023-09-16 NOTE — Progress Notes (Unsigned)
    Tristan Boomer T. Beacher Every, MD, CAQ Sports Medicine [facility] at [facility] [address] [place], [address]  Phone: [phone]  FAX: [phone]  Tristan Mcgee - 42 y.o. male  MRN [id number]  Date of Birth: January 06, 1981  Date: 09/18/2023  PCP: Hannah Beat, MD  Referral: Hannah Beat, MD  No chief complaint on file.  Subjective:   Tristan Mcgee is a 42 y.o. very pleasant male patient with There is no height or weight on file to calculate BMI. who presents with the following:  He is here to talk about ongoing struggles with his weight and medication management with Mounjaro / Zepbound.    Review of Systems is noted in the HPI, as appropriate  Objective:   There were no vitals taken for this visit.  GEN: No acute distress; alert,appropriate. PULM: Breathing comfortably in no respiratory distress PSYCH: Normally interactive.   Laboratory and Imaging Data:  Assessment and Plan:   ***

## 2023-09-18 ENCOUNTER — Ambulatory Visit: Payer: 59 | Admitting: Family Medicine

## 2023-09-18 ENCOUNTER — Encounter: Payer: Self-pay | Admitting: Family Medicine

## 2023-09-18 DIAGNOSIS — Z6841 Body Mass Index (BMI) 40.0 and over, adult: Secondary | ICD-10-CM | POA: Diagnosis not present

## 2023-10-24 ENCOUNTER — Telehealth: Payer: Self-pay | Admitting: Family Medicine

## 2023-10-24 DIAGNOSIS — E7800 Pure hypercholesterolemia, unspecified: Secondary | ICD-10-CM

## 2023-10-24 MED ORDER — ROSUVASTATIN CALCIUM 20 MG PO TABS: 20.0000 mg | ORAL_TABLET | Freq: Every day | ORAL | 0 refills | Status: DC

## 2023-10-24 MED ORDER — PANTOPRAZOLE SODIUM 40 MG PO TBEC: 40.0000 mg | DELAYED_RELEASE_TABLET | Freq: Every morning | ORAL | 0 refills | Status: DC

## 2023-10-24 MED ORDER — FLUOXETINE HCL 20 MG PO CAPS: 20.0000 mg | ORAL_CAPSULE | Freq: Every day | ORAL | 0 refills | Status: DC

## 2023-10-24 NOTE — Telephone Encounter (Signed)
Please schedule CPE with fasting labs prior for Dr. Copland.  

## 2023-10-24 NOTE — Telephone Encounter (Signed)
Patient has been scheduled

## 2023-11-13 ENCOUNTER — Telehealth: Payer: Self-pay | Admitting: *Deleted

## 2023-11-13 DIAGNOSIS — E782 Mixed hyperlipidemia: Secondary | ICD-10-CM

## 2023-11-13 DIAGNOSIS — Z131 Encounter for screening for diabetes mellitus: Secondary | ICD-10-CM

## 2023-11-13 DIAGNOSIS — Z79899 Other long term (current) drug therapy: Secondary | ICD-10-CM

## 2023-11-13 NOTE — Telephone Encounter (Signed)
-----   Message from Lovena Neighbours sent at 11/13/2023  2:33 PM EST ----- Regarding: Labs for Tuesday 1.22.25 Please put physical lab orders in future. Thank you, Denny Peon

## 2023-11-15 ENCOUNTER — Encounter: Payer: Self-pay | Admitting: Family Medicine

## 2023-11-26 ENCOUNTER — Other Ambulatory Visit (INDEPENDENT_AMBULATORY_CARE_PROVIDER_SITE_OTHER): Payer: 59

## 2023-11-26 DIAGNOSIS — E782 Mixed hyperlipidemia: Secondary | ICD-10-CM | POA: Diagnosis not present

## 2023-11-26 DIAGNOSIS — Z79899 Other long term (current) drug therapy: Secondary | ICD-10-CM

## 2023-11-26 DIAGNOSIS — Z131 Encounter for screening for diabetes mellitus: Secondary | ICD-10-CM

## 2023-11-26 LAB — BASIC METABOLIC PANEL
BUN: 14 mg/dL (ref 6–23)
CO2: 28 meq/L (ref 19–32)
Calcium: 9.3 mg/dL (ref 8.4–10.5)
Chloride: 105 meq/L (ref 96–112)
Creatinine, Ser: 0.97 mg/dL (ref 0.40–1.50)
GFR: 97.02 mL/min (ref >60.00)
Glucose, Bld: 79 mg/dL (ref 70–99)
Potassium: 4.2 meq/L (ref 3.5–5.1)
Sodium: 140 meq/L (ref 135–145)

## 2023-11-26 LAB — CBC WITH DIFFERENTIAL/PLATELET
Basophils Absolute: 0 10*3/uL (ref 0.0–0.1)
Basophils Relative: 0.4 % (ref 0.0–3.0)
Eosinophils Absolute: 0.1 10*3/uL (ref 0.0–0.7)
Eosinophils Relative: 2.6 % (ref 0.0–5.0)
HCT: 41.9 % (ref 39.0–52.0)
Hemoglobin: 13.8 g/dL (ref 13.0–17.0)
Lymphocytes Relative: 29.6 % (ref 12.0–46.0)
Lymphs Abs: 1.7 10*3/uL (ref 0.7–4.0)
MCHC: 32.8 g/dL (ref 30.0–36.0)
MCV: 90.2 fL (ref 78.0–100.0)
Monocytes Absolute: 0.5 10*3/uL (ref 0.1–1.0)
Monocytes Relative: 9.4 % (ref 3.0–12.0)
Neutro Abs: 3.3 10*3/uL (ref 1.4–7.7)
Neutrophils Relative %: 58 % (ref 43.0–77.0)
Platelets: 252 10*3/uL (ref 150.0–400.0)
RBC: 4.65 Mil/uL (ref 4.22–5.81)
RDW: 15.1 % (ref 11.5–15.5)
WBC: 5.7 10*3/uL (ref 4.0–10.5)

## 2023-11-26 LAB — LIPID PANEL
Cholesterol: 179 mg/dL (ref 0–200)
HDL: 69.4 mg/dL (ref >39.00)
LDL Cholesterol: 87 mg/dL (ref 0–99)
NonHDL: 109.21
Total CHOL/HDL Ratio: 3
Triglycerides: 110 mg/dL (ref 0.0–149.0)
VLDL: 22 mg/dL (ref 0.0–40.0)

## 2023-11-26 LAB — HEMOGLOBIN A1C: Hgb A1c MFr Bld: 5.2 % (ref 4.6–6.5)

## 2023-11-26 LAB — HEPATIC FUNCTION PANEL
ALT: 32 U/L (ref 0–53)
AST: 30 U/L (ref 0–37)
Albumin: 4.6 g/dL (ref 3.5–5.2)
Alkaline Phosphatase: 36 U/L — ABNORMAL LOW (ref 39–117)
Bilirubin, Direct: 0.1 mg/dL (ref 0.0–0.3)
Total Bilirubin: 0.6 mg/dL (ref 0.2–1.2)
Total Protein: 7.1 g/dL (ref 6.0–8.3)

## 2023-12-03 ENCOUNTER — Other Ambulatory Visit: Payer: Self-pay | Admitting: Family Medicine

## 2023-12-07 NOTE — Progress Notes (Unsigned)
Tristan Godley [name], MD, CAQ [facility] [address] [place], [address]  Phone: [phone]  FAX: [phone]  Tristan Mcgee - 43 y.o. male  MRN [id number]  Date of Birth: 13-Sep-1981  Date: 12/08/2023  PCP: Hannah Beat, MD  Referral: Hannah Beat, MD  No chief complaint on file.  Patient Care Team: Hannah Beat, MD as PCP - General Subjective:   Tristan Mcgee is a 43 y.o. pleasant patient who presents with the following:  Preventative Health Maintenance Visit:  Health Maintenance Summary Reviewed and updated, unless pt declines services.  Tobacco History Reviewed. Alcohol: No concerns, no excessive use Exercise Habits: Some activity, rec at least 30 mins 5 times a week STD concerns: no risk or activity to increase risk Drug Use: None  He is a very nice long-term patient for many years.  He presents today for a complete physical.  He does have stable ADHD and takes Concerta 36 mg a day.  Lipids: Doing well, stable. Tolerating meds fine with no SE. Panel reviewed with patient.  Lipids: Lab Results  Component Value Date   CHOL 179 11/26/2023   Lab Results  Component Value Date   HDL 69.40 11/26/2023   Lab Results  Component Value Date   LDLCALC 87 11/26/2023   Lab Results  Component Value Date   TRIG 110.0 11/26/2023   Lab Results  Component Value Date   CHOLHDL 3 11/26/2023    Lab Results  Component Value Date   ALT 32 11/26/2023   AST 30 11/26/2023   ALKPHOS 36 (L) 11/26/2023   BILITOT 0.6 11/26/2023     Health Maintenance  Topic Date Due   Pneumococcal Vaccine 39-69 Years old (1 of 2 - PCV) Never done   COVID-19 Vaccine (7 - 2024-25 season) 09/27/2023   DTaP/Tdap/Td (2 - Td or Tdap) 05/16/2026   INFLUENZA VACCINE  Completed   Hepatitis C Screening  Completed   HIV Screening  Completed   HPV VACCINES  Aged Out   Immunization History  Administered Date(s) Administered    Hepatitis A, Adult 08/12/2022   Influenza, Seasonal, Injecte, Preservative Fre 08/02/2023   Influenza,inj,Quad PF,6+ Mos 07/31/2017, 08/19/2018, 09/30/2021, 08/12/2022   Influenza-Unspecified 08/26/2019, 08/23/2020   Moderna Covid-19 Vaccine Bivalent Booster 81yrs & up 09/30/2021   Moderna Sars-Covid-2 Vaccination 01/20/2020, 02/17/2020, 09/27/2020   Pfizer(Comirnaty)Fall Seasonal Vaccine 12 years and older 08/16/2022, 08/02/2023   Tdap 05/16/2016   Patient Active Problem List   Diagnosis Date Noted   High cholesterol 07/05/2022    Priority: Medium    H/O bariatric surgery (SLEEVE) 09/21/2015    Priority: Medium    Hypertension 10/17/2010    Priority: Medium    Attention deficit disorder 08/23/2009    Priority: Low   Left varicocele 10/02/2020   Concussion without loss of consciousness 09/21/2015   Asthma 11/01/2010    Past Medical History:  Diagnosis Date   ADD (attention deficit disorder)    Allergic rhinitis due to pollen    Asthma    Concussion without loss of consciousness 09/21/2015   H/O bariatric surgery (SLEEVE) 09/21/2015   Hypertension 10/17/2010    Past Surgical History:  Procedure Laterality Date   CHOLECYSTECTOMY     SLEEVE GASTROPLASTY  2015    Family History  Problem Relation Age of Onset   Hypertension Father    Cancer Maternal Grandmother    Heart disease Maternal Grandfather    Colon cancer Paternal Grandmother  Heart disease Paternal Grandfather     Social History   Social History Narrative   Regular exercise-yes    Past Medical History, Surgical History, Social History, Family History, Problem List, Medications, and Allergies have been reviewed and updated if relevant.  Review of Systems: Pertinent positives are listed above.  Otherwise, a full 14 point review of systems has been done in full and it is negative except where it is noted positive.  Objective:   There were no vitals taken for this visit. Ideal Body Weight:     Ideal Body Weight:   No results found.    11/13/2022    2:56 PM 10/04/2021    9:01 AM 10/02/2020    9:09 AM 04/20/2020    8:55 AM 09/13/2019    9:23 AM  Depression screen PHQ 2/9  Decreased Interest 0 0 0 0 0  Down, Depressed, Hopeless 0 0 0 0 0  PHQ - 2 Score 0 0 0 0 0  Altered sleeping    1   Tired, decreased energy    1   Change in appetite    1   Feeling bad or failure about yourself     0   Trouble concentrating    2   Moving slowly or fidgety/restless    0   Suicidal thoughts    0   PHQ-9 Score    5   Difficult doing work/chores    Somewhat difficult      GEN: well developed, well nourished, no acute distress Eyes: conjunctiva and lids normal, PERRLA, EOMI ENT: TM clear, nares clear, oral exam WNL Neck: supple, no lymphadenopathy, no thyromegaly, no JVD Pulm: clear to auscultation and percussion, respiratory effort normal CV: regular rate and rhythm, S1-S2, no murmur, rub or gallop, no bruits, peripheral pulses normal and symmetric, no cyanosis, clubbing, edema or varicosities GI: soft, non-tender; no hepatosplenomegaly, masses; active bowel sounds all quadrants GU: deferred Lymph: no cervical, axillary or inguinal adenopathy MSK: gait normal, muscle tone and strength WNL, no joint swelling, effusions, discoloration, crepitus  SKIN: clear, good turgor, color WNL, no rashes, lesions, or ulcerations Neuro: normal mental status, normal strength, sensation, and motion Psych: alert; oriented to person, place and time, normally interactive and not anxious or depressed in appearance.  All labs reviewed with patient. Results for orders placed or performed in visit on 11/26/23  Hepatic Function Panel   Collection Time: 11/26/23  8:06 AM  Result Value Ref Range   Total Bilirubin 0.6 0.2 - 1.2 mg/dL   Bilirubin, Direct 0.1 0.0 - 0.3 mg/dL   Alkaline Phosphatase 36 (L) 39 - 117 U/L   AST 30 0 - 37 U/L   ALT 32 0 - 53 U/L   Total Protein 7.1 6.0 - 8.3 g/dL   Albumin 4.6 3.5  - 5.2 g/dL  Basic metabolic panel   Collection Time: 11/26/23  8:06 AM  Result Value Ref Range   Sodium 140 135 - 145 mEq/L   Potassium 4.2 3.5 - 5.1 mEq/L   Chloride 105 96 - 112 mEq/L   CO2 28 19 - 32 mEq/L   Glucose, Bld 79 70 - 99 mg/dL   BUN 14 6 - 23 mg/dL   Creatinine, Ser 9.60 0.40 - 1.50 mg/dL   GFR 45.40 >98.11 mL/min   Calcium 9.3 8.4 - 10.5 mg/dL  CBC with Differential/Platelet   Collection Time: 11/26/23  8:06 AM  Result Value Ref Range   WBC 5.7 4.0 - 10.5 K/uL  RBC 4.65 4.22 - 5.81 Mil/uL   Hemoglobin 13.8 13.0 - 17.0 g/dL   HCT 09.8 11.9 - 14.7 %   MCV 90.2 78.0 - 100.0 fl   MCHC 32.8 30.0 - 36.0 g/dL   RDW 82.9 56.2 - 13.0 %   Platelets 252.0 150.0 - 400.0 K/uL   Neutrophils Relative % 58.0 43.0 - 77.0 %   Lymphocytes Relative 29.6 12.0 - 46.0 %   Monocytes Relative 9.4 3.0 - 12.0 %   Eosinophils Relative 2.6 0.0 - 5.0 %   Basophils Relative 0.4 0.0 - 3.0 %   Neutro Abs 3.3 1.4 - 7.7 K/uL   Lymphs Abs 1.7 0.7 - 4.0 K/uL   Monocytes Absolute 0.5 0.1 - 1.0 K/uL   Eosinophils Absolute 0.1 0.0 - 0.7 K/uL   Basophils Absolute 0.0 0.0 - 0.1 K/uL  Hemoglobin A1c   Collection Time: 11/26/23  8:06 AM  Result Value Ref Range   Hgb A1c MFr Bld 5.2 4.6 - 6.5 %  Lipid panel   Collection Time: 11/26/23  8:06 AM  Result Value Ref Range   Cholesterol 179 0 - 200 mg/dL   Triglycerides 865.7 0.0 - 149.0 mg/dL   HDL 84.69 >62.95 mg/dL   VLDL 28.4 0.0 - 13.2 mg/dL   LDL Cholesterol 87 0 - 99 mg/dL   Total CHOL/HDL Ratio 3    NonHDL 109.21     Assessment and Plan:     ICD-10-CM   1. Healthcare maintenance  Z00.00       Health Maintenance Exam: The patient's preventative maintenance and recommended screening tests for an annual wellness exam were reviewed in full today. Brought up to date unless services declined.  Counselled on the importance of diet, exercise, and its role in overall health and mortality. The patient's FH and SH was reviewed, including their  home life, tobacco status, and drug and alcohol status.  Follow-up in 1 year for physical exam or additional follow-up below.  Disposition: No follow-ups on file.  No orders of the defined types were placed in this encounter.  There are no discontinued medications. No orders of the defined types were placed in this encounter.   Signed,  Elpidio Galea. Karter Hellmer, MD   Allergies as of 12/08/2023       Reactions   Bactrim [sulfamethoxazole-trimethoprim] Other (See Comments)   Joint & Muscle Pain   Clindamycin/lincomycin Other (See Comments)   C-Diff Infection        Medication List        Accurate as of December 07, 2023 11:23 AM. If you have any questions, ask your nurse or doctor.          albuterol 108 (90 Base) MCG/ACT inhaler Commonly known as: VENTOLIN HFA Inhale 2 puffs into the lungs every 6 (six) hours as needed.   Cibinqo 100 MG Tabs Generic drug: Abrocitinib Take 1 tablet by mouth daily.   EPINEPHrine 0.3 mg/0.3 mL Soaj injection Commonly known as: EPI-PEN Inject into the muscle.   finasteride 1 MG tablet Commonly known as: PROPECIA Take 1 mg by mouth daily.   fluconazole 150 MG tablet Commonly known as: DIFLUCAN Take 1 tab once weekly for 4 weeks   FLUoxetine 20 MG capsule Commonly known as: PROZAC Take 1 capsule (20 mg total) by mouth daily.   Loratadine 10 MG Caps Take 1 capsule by mouth daily.   methylphenidate 36 MG CR tablet Commonly known as: Concerta Take 1 tablet (36 mg total) by mouth daily.  minoxidil 2.5 MG tablet Commonly known as: LONITEN Take 2.5 mg by mouth daily.   montelukast 10 MG tablet Commonly known as: SINGULAIR Take 10 mg by mouth at bedtime.   NONFORMULARY OR COMPOUNDED ITEM Compound Tirzepatide 2.5 mg per injection Inject 2.5 mg weekly for 4 weeks Disp 1 month supply   NONFORMULARY OR COMPOUNDED ITEM Compound Tirzepatide 5 mg per injection Inject 5 mg weekly for 4 weeks Disp 1 month supply   NONFORMULARY  OR COMPOUNDED ITEM Compound Tirzepatide 7.5 mg Inject 7.5 mg weekly Disp 1 month supply, 3 refills   pantoprazole 40 MG tablet Commonly known as: PROTONIX Take 1 tablet (40 mg total) by mouth every morning.   rosuvastatin 20 MG tablet Commonly known as: Crestor Take 1 tablet (20 mg total) by mouth daily.   Spacer/Aero-Holding [employer] Use while using albuterol, 2 puffs prn q 8 hours   tirzepatide 7.5 MG/0.5ML Pen Commonly known as: ZEPBOUND Inject contents of 1 syringe (7.5mg  dose) subcutaneously once weekly.

## 2023-12-08 ENCOUNTER — Ambulatory Visit (INDEPENDENT_AMBULATORY_CARE_PROVIDER_SITE_OTHER): Payer: 59 | Admitting: Family Medicine

## 2023-12-08 ENCOUNTER — Encounter: Payer: Self-pay | Admitting: Family Medicine

## 2023-12-08 VITALS — BP 100/80 | HR 73 | Temp 97.4°F | Ht 72.0 in | Wt 293.4 lb

## 2023-12-08 DIAGNOSIS — Z0000 Encounter for general adult medical examination without abnormal findings: Secondary | ICD-10-CM

## 2023-12-29 ENCOUNTER — Encounter: Payer: Self-pay | Admitting: Family Medicine

## 2023-12-29 ENCOUNTER — Other Ambulatory Visit: Payer: Self-pay | Admitting: Family Medicine

## 2023-12-29 DIAGNOSIS — F900 Attention-deficit hyperactivity disorder, predominantly inattentive type: Secondary | ICD-10-CM

## 2023-12-29 MED ORDER — METHYLPHENIDATE HCL ER (OSM) 36 MG PO TBCR: 36.0000 mg | EXTENDED_RELEASE_TABLET | Freq: Every day | ORAL | 0 refills | Status: DC

## 2023-12-29 NOTE — Telephone Encounter (Signed)
 Last office visit 12/08/23 for CPE.  Last refilled 09/14/2023 for #90 with no refills.  Next Appt: No future appointments.

## 2024-01-07 MED ORDER — TIRZEPATIDE-WEIGHT MANAGEMENT 10 MG/0.5ML ~~LOC~~ SOAJ: 10.0000 mg | SUBCUTANEOUS | 0 refills | Status: DC

## 2024-01-20 ENCOUNTER — Other Ambulatory Visit: Payer: Self-pay | Admitting: Family Medicine

## 2024-01-20 DIAGNOSIS — E7800 Pure hypercholesterolemia, unspecified: Secondary | ICD-10-CM

## 2024-04-02 ENCOUNTER — Encounter: Payer: Self-pay | Admitting: Family Medicine

## 2024-04-02 DIAGNOSIS — F900 Attention-deficit hyperactivity disorder, predominantly inattentive type: Secondary | ICD-10-CM

## 2024-04-02 MED ORDER — METHYLPHENIDATE HCL ER (OSM) 36 MG PO TBCR: 36.0000 mg | EXTENDED_RELEASE_TABLET | Freq: Every day | ORAL | 0 refills | Status: DC

## 2024-04-02 MED ORDER — TIRZEPATIDE-WEIGHT MANAGEMENT 10 MG/0.5ML ~~LOC~~ SOAJ: 10.0000 mg | SUBCUTANEOUS | 0 refills | Status: DC

## 2024-04-02 NOTE — Telephone Encounter (Signed)
 Last office visit 12/08/2023 for CPE.  Last refilled 12/29/2023 for #90 with no refills.  Next Appt: No future appointments.

## 2024-05-13 ENCOUNTER — Encounter: Payer: Self-pay | Admitting: Family Medicine

## 2024-05-16 MED ORDER — FLUCONAZOLE 200 MG PO TABS: ORAL_TABLET | ORAL | 0 refills | Status: AC

## 2024-07-13 ENCOUNTER — Encounter: Payer: Self-pay | Admitting: Family Medicine

## 2024-07-13 MED ORDER — ZEPBOUND 12.5 MG/0.5ML ~~LOC~~ SOAJ: 12.5000 mg | SUBCUTANEOUS | 0 refills | Status: DC

## 2024-07-23 ENCOUNTER — Encounter: Payer: Self-pay | Admitting: Family Medicine

## 2024-07-23 DIAGNOSIS — F900 Attention-deficit hyperactivity disorder, predominantly inattentive type: Secondary | ICD-10-CM

## 2024-07-23 MED ORDER — METHYLPHENIDATE HCL ER (OSM) 36 MG PO TBCR: 36.0000 mg | EXTENDED_RELEASE_TABLET | Freq: Every day | ORAL | 0 refills | Status: DC

## 2024-07-23 NOTE — Telephone Encounter (Signed)
 Name of Medication: Concerta  36 mg  Name of Pharmacy: [facility]  Last Fill or Written Date and Quantity:  04/02/24 #90 no rf  Last Office Visit and Type: CPE 12/08/23  Next Office Visit and Type: none

## 2024-08-16 ENCOUNTER — Encounter: Payer: Self-pay | Admitting: Family Medicine

## 2024-08-17 MED ORDER — ZEPBOUND 15 MG/0.5ML ~~LOC~~ SOLN: 15.0000 mg | SUBCUTANEOUS | 5 refills | Status: AC

## 2024-10-24 ENCOUNTER — Encounter: Payer: Self-pay | Admitting: Family Medicine

## 2024-10-24 DIAGNOSIS — F900 Attention-deficit hyperactivity disorder, predominantly inattentive type: Secondary | ICD-10-CM

## 2024-10-25 MED ORDER — METHYLPHENIDATE HCL ER (OSM) 36 MG PO TBCR: 36.0000 mg | EXTENDED_RELEASE_TABLET | Freq: Every day | ORAL | 0 refills | Status: AC

## 2024-10-25 NOTE — Telephone Encounter (Signed)
 Last office visit 12/08/2023 for CPE.  Last refilled 07/23/2024 for #90 with no refills.  Next appt: No future appointments.

## 2024-12-23 ENCOUNTER — Encounter: Admitting: Family Medicine
